# Patient Record
Sex: Male | Born: 1990 | Race: White | Hispanic: No | Marital: Single | Smoking: Former smoker
Health system: Southern US, Community
[De-identification: ages and names within clinical notes are randomized; demographics above are authoritative.]

## PROBLEM LIST (undated history)

## (undated) DIAGNOSIS — R002 Palpitations: Secondary | ICD-10-CM

## (undated) DIAGNOSIS — Z8619 Personal history of other infectious and parasitic diseases: Secondary | ICD-10-CM

## (undated) DIAGNOSIS — J45990 Exercise induced bronchospasm: Secondary | ICD-10-CM

## (undated) DIAGNOSIS — F419 Anxiety disorder, unspecified: Secondary | ICD-10-CM

## (undated) DIAGNOSIS — F329 Major depressive disorder, single episode, unspecified: Secondary | ICD-10-CM

## (undated) DIAGNOSIS — F32A Depression, unspecified: Secondary | ICD-10-CM

## (undated) HISTORY — DX: Major depressive disorder, single episode, unspecified: F32.9

## (undated) HISTORY — DX: Anxiety disorder, unspecified: F41.9

## (undated) HISTORY — DX: Depression, unspecified: F32.A

## (undated) HISTORY — DX: Personal history of other infectious and parasitic diseases: Z86.19

## (undated) HISTORY — DX: Exercise induced bronchospasm: J45.990

## (undated) HISTORY — DX: Palpitations: R00.2

## (undated) HISTORY — PX: WISDOM TOOTH EXTRACTION: SHX21

---

## 2016-01-30 DIAGNOSIS — H6690 Otitis media, unspecified, unspecified ear: Secondary | ICD-10-CM | POA: Diagnosis not present

## 2016-03-15 ENCOUNTER — Encounter: Payer: Self-pay | Admitting: Family

## 2016-03-15 ENCOUNTER — Ambulatory Visit (INDEPENDENT_AMBULATORY_CARE_PROVIDER_SITE_OTHER): Payer: BLUE CROSS/BLUE SHIELD | Admitting: Family

## 2016-03-15 VITALS — BP 118/80 | HR 89 | Temp 98.3°F | Resp 16 | Ht 71.0 in | Wt 200.8 lb

## 2016-03-15 DIAGNOSIS — Z0000 Encounter for general adult medical examination without abnormal findings: Secondary | ICD-10-CM

## 2016-03-15 DIAGNOSIS — Z23 Encounter for immunization: Secondary | ICD-10-CM | POA: Diagnosis not present

## 2016-03-15 DIAGNOSIS — Z0001 Encounter for general adult medical examination with abnormal findings: Secondary | ICD-10-CM | POA: Insufficient documentation

## 2016-03-15 NOTE — Assessment & Plan Note (Signed)
1) Anticipatory Guidance: Discussed importance of wearing a seatbelt while driving and not texting while driving; changing batteries in smoke detector at least once annually; wearing suntan lotion when outside; eating a balanced and moderate diet; getting physical activity at least 30 minutes per day.  2) Immunizations / Screenings / Labs:  Declines tetanus. Due for flu shot at next visit. All other immunizations are up to date per recommendations. Obtain HIV, HSV-1, HSV 2, and RPR for sexual transmitted disease screening. Obtain testosterone for testosterone screening. Obtain magnesium for electrolyte screening. Obtain CBC, CMET, and lipid profile.    Overall well exam with minimal risk factors for cardiovascular disease at this time. BMI indicates overweight. Recommend continuing to exercise and adjust diet accordingly. 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 Indicates work is going very well. Continue healthy lifestyle behaviors and choices. Follow-up prevention exam in 1 year. Follow-up office visit pending blood work as needed.

## 2016-03-15 NOTE — Progress Notes (Signed)
Subjective:    Patient ID: Ricardo Knight, male    DOB: Jan 30, 1991, 25 y.o.   MRN: [id number]  Chief Complaint  Patient presents with  . Establish Care    CPE, not fasting    HPI:  Ricardo Sendericholas Surgeon is a 25 y.o. male who presents today for an annual wellness visit.   1) Health Maintenance -   Diet - Averages about 2-3 meals per day consisting of a regular diet; Caffeine intake of 2-3 cups per week.   Exercise - 3 days per week; primarily resistance with occasional cardio   2) Preventative Exams / Immunizations:  Dental -- Up to date  Vision -- Up to date   Health Maintenance  Topic Date Due  . HIV Screening  01/08/2005  . INFLUENZA VACCINE  10/25/2015  . TETANUS/TDAP  02/25/2017 (Originally 01/08/2009)    Immunization History  Administered Date(s) Administered  . Influenza,inj,Quad PF,36+ Mos 03/15/2016     No Known Allergies   No outpatient prescriptions prior to visit.   No facility-administered medications prior to visit.      Past Medical History:  Diagnosis Date  . Anxiety   . Depression   . Exercise-induced asthma   . Heart palpitations   . History of chicken pox      Past Surgical History:  Procedure Laterality Date  . WISDOM TOOTH EXTRACTION       Family History  Problem Relation Age of Onset  . Depression Mother   . Anxiety disorder Mother   . Hypertension Father   . Hyperlipidemia Father   . Anxiety disorder Maternal Grandmother   . Heart attack Maternal Grandmother   . Heart disease Maternal Grandfather   . Hypertension Maternal Grandfather   . Healthy Paternal Grandmother   . Cancer Paternal Grandfather      Social History   Social History  . Marital status: Single    Spouse name: N/A  . Number of children: 0  . Years of education: 3216   Occupational History  . Lobbyist     Social History Main Topics  . Smoking status: Never Smoker  . Smokeless tobacco: Never Used  . Alcohol use 4.2 - 6.0 oz/week    7 - 10  Cans of beer per week  . Drug use: No  . Sexual activity: Not on file   Other Topics Concern  . Not on file   Social History Narrative   Fun: Exercise, guitar       Review of Systems  Constitutional: Denies fever, chills, fatigue, or significant weight gain/loss. HENT: Head: Denies headache or neck pain Ears: Denies changes in hearing, ringing in ears, earache, drainage Nose: Denies discharge, stuffiness, itching, nosebleed, sinus pain Throat: Denies sore throat, hoarseness, dry mouth, sores, thrush Eyes: Denies loss/changes in vision, pain, redness, blurry/double vision, flashing lights Cardiovascular: Denies chest pain/discomfort, tightness, palpitations, shortness of breath with activity, difficulty lying down, swelling, sudden awakening with shortness of breath Respiratory: Denies shortness of breath, cough, sputum production, wheezing Gastrointestinal: Denies dysphasia, heartburn, change in appetite, nausea, change in bowel habits, rectal bleeding, constipation, diarrhea, yellow skin or eyes Genitourinary: Denies frequency, urgency, burning/pain, blood in urine, incontinence, change in urinary strength. Musculoskeletal: Denies muscle/joint pain, stiffness, back pain, redness or swelling of joints, trauma Skin: Denies rashes, lumps, itching, dryness, color changes, or hair/nail changes Neurological: Denies dizziness, fainting, seizures, weakness, numbness, tingling, tremor Psychiatric - Denies nervousness, stress, depression or memory loss Endocrine: Denies heat or cold intolerance, sweating, frequent urination, excessive  thirst, changes in appetite Hematologic: Denies ease of bruising or bleeding     Objective:     BP 118/80 (BP Location: Left Arm, Patient Position: Sitting, Cuff Size: Normal)   Pulse 89   Temp 98.3 F (36.8 C) (Oral)   Resp 16   Ht 5\' 11"  (1.803 m)   Wt 200 lb 12.8 oz (91.1 kg)   SpO2 98%   BMI 28.01 kg/m  Nursing note and vital signs  reviewed.  Physical Exam  Constitutional: He is oriented to person, place, and time. He appears well-developed and well-nourished.  HENT:  Head: Normocephalic.  Right Ear: Hearing, tympanic membrane, external ear and ear canal normal.  Left Ear: Hearing, tympanic membrane, external ear and ear canal normal.  Nose: Nose normal.  Mouth/Throat: Uvula is midline, oropharynx is clear and moist and mucous membranes are normal.  Eyes: Conjunctivae and EOM are normal. Pupils are equal, round, and reactive to light.  Neck: Neck supple. No JVD present. No tracheal deviation present. No thyromegaly present.  Cardiovascular: Normal rate, regular rhythm, normal heart sounds and intact distal pulses.   Pulmonary/Chest: Effort normal and breath sounds normal.  Abdominal: Soft. Bowel sounds are normal. He exhibits no distension and no mass. There is no tenderness. There is no rebound and no guarding.  Musculoskeletal: Normal range of motion. He exhibits no edema or tenderness.  Lymphadenopathy:    He has no cervical adenopathy.  Neurological: He is alert and oriented to person, place, and time. He has normal reflexes. No cranial nerve deficit. He exhibits normal muscle tone. Coordination normal.  Skin: Skin is warm and dry.  Psychiatric: He has a normal mood and affect. His behavior is normal. Judgment and thought content normal.       Assessment & Plan:   Problem List Items Addressed This Visit      Other   Routine general medical examination at a health care facility - Primary    1) Anticipatory Guidance: Discussed importance of wearing a seatbelt while driving and not texting while driving; changing batteries in smoke detector at least once annually; wearing suntan lotion when outside; eating a balanced and moderate diet; getting physical activity at least 30 minutes per day.  2) Immunizations / Screenings / Labs:  Declines tetanus. Due for flu shot at next visit. All other immunizations are up  to date per recommendations. Obtain HIV, HSV-1, HSV 2, and RPR for sexual transmitted disease screening. Obtain testosterone for testosterone screening. Obtain magnesium for electrolyte screening. Obtain CBC, CMET, and lipid profile.    Overall well exam with minimal risk factors for cardiovascular disease at this time. BMI indicates overweight. Recommend continuing to exercise and adjust diet accordingly. 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 Indicates work is going very well. Continue healthy lifestyle behaviors and choices. Follow-up prevention exam in 1 year. Follow-up office visit pending blood work as needed.       Relevant Orders   CBC   Comprehensive metabolic panel   Lipid panel   TSH   Magnesium   HIV antibody   HSV 1 antibody, IgG   HSV 2 antibody, IgG   RPR   Testosterone    Other Visit Diagnoses    Encounter for immunization       Relevant Orders   Flu Vaccine QUAD 36+ mos IM (Completed)       Mr. Shin does not currently have medications on file.   Follow-up: Return in about 1 year (  around 03/15/2017), or if symptoms worsen or fail to improve.   Jeanine Luzalone, Gregory, FNP

## 2016-03-15 NOTE — Patient Instructions (Addendum)
Thank you for choosing [facility].  SUMMARY AND INSTRUCTIONS:  Very nice to meet you.   Happy Holidays!  For your testosterone check please come before 10am.   Labs:  Please stop by the lab on the lower level of the building for your blood work. Your results will be released to MyChart (or called to you) after review, usually within 72 hours after test completion. If any changes need to be made, you will be notified at that same time.  1.) The lab is open from 7:30am to 5:30 pm Monday-Friday 2.) No appointment is necessary 3.) Fasting (if needed) is 6-8 hours after food and drink; black coffee and water are okay   Follow up:  If your symptoms worsen or fail to improve, please contact our office for further instruction, or in case of emergency go directly to the emergency room at the closest medical facility.   Health Maintenance, Male A healthy lifestyle and preventative care can promote health and wellness.  Maintain regular health, dental, and eye exams.  Eat a healthy diet. Foods like vegetables, fruits, whole grains, low-fat dairy products, and lean protein foods contain the nutrients you need and are low in calories. Decrease your intake of foods high in solid fats, added sugars, and salt. Get information about a proper diet from your health care provider, if necessary.  Regular physical exercise is one of the most important things you can do for your health. Most adults should get at least 150 minutes of moderate-intensity exercise (any activity that increases your heart rate and causes you to sweat) each week. In addition, most adults need muscle-strengthening exercises on 2 or more days a week.   Maintain a healthy weight. The body mass index (BMI) is a screening tool to identify possible weight problems. It provides an estimate of body fat based on height and weight. Your health care provider can find your BMI and can help you achieve or maintain a healthy weight. For  males 20 years and older:  A BMI below 18.5 is considered underweight.  A BMI of 18.5 to 24.9 is normal.  A BMI of 25 to 29.9 is considered overweight.  A BMI of 30 and above is considered obese.  Maintain normal blood lipids and cholesterol by exercising and minimizing your intake of saturated fat. Eat a balanced diet with plenty of fruits and vegetables. Blood tests for lipids and cholesterol should begin at age 26 and be repeated every 5 years. If your lipid or cholesterol levels are high, you are over age 250, or you are at high risk for heart disease, you may need your cholesterol levels checked more frequently.Ongoing high lipid and cholesterol levels should be treated with medicines if diet and exercise are not working.  If you smoke, find out from your health care provider how to quit. If you do not use tobacco, do not start.  Lung cancer screening is recommended for adults aged 55-80 years who are at high risk for developing lung cancer because of a history of smoking. A yearly low-dose CT scan of the lungs is recommended for people who have at least a 30-pack-year history of smoking and are current smokers or have quit within the past 15 years. A pack year of smoking is smoking an average of 1 pack of cigarettes a day for 1 year (for example, a 30-pack-year history of smoking could mean smoking 1 pack a day for 30 years or 2 packs a day for 15 years). Yearly  screening should continue until the smoker has stopped smoking for at least 15 years. Yearly screening should be stopped for people who develop a health problem that would prevent them from having lung cancer treatment.  If you choose to drink alcohol, do not have more than 2 drinks per day. One drink is considered to be 12 oz (360 mL) of beer, 5 oz (150 mL) of wine, or 1.5 oz (45 mL) of liquor.  Avoid the use of street drugs. Do not share needles with anyone. Ask for help if you need support or instructions about stopping the use of  drugs.  High blood pressure causes heart disease and increases the risk of stroke. High blood pressure is more likely to develop in:  People who have blood pressure in the end of the normal range (100-139/85-89 mm Hg).  People who are overweight or obese.  People who are African American.  If you are 48-31 years of age, have your blood pressure checked every 3-5 years. If you are 85 years of age or older, have your blood pressure checked every year. You should have your blood pressure measured twice-once when you are at a hospital or clinic, and once when you are not at a hospital or clinic. Record the average of the two measurements. To check your blood pressure when you are not at a hospital or clinic, you can use:  An automated blood pressure machine at a pharmacy.  A home blood pressure monitor.  If you are 12-71 years old, ask your health care provider if you should take aspirin to prevent heart disease.  Diabetes screening involves taking a blood sample to check your fasting blood sugar level. This should be done once every 3 years after age 67 if you are at a normal weight and without risk factors for diabetes. Testing should be considered at a younger age or be carried out more frequently if you are overweight and have at least 1 risk factor for diabetes.  Colorectal cancer can be detected and often prevented. Most routine colorectal cancer screening begins at the age of 22 and continues through age 8. However, your health care provider may recommend screening at an earlier age if you have risk factors for colon cancer. On a yearly basis, your health care provider may provide home test kits to check for hidden blood in the stool. A small camera at the end of a tube may be used to directly examine the colon (sigmoidoscopy or colonoscopy) to detect the earliest forms of colorectal cancer. Talk to your health care provider about this at age 8 when routine screening begins. A direct exam of  the colon should be repeated every 5-10 years through age 69, unless early forms of precancerous polyps or small growths are found.  People who are at an increased risk for hepatitis B should be screened for this virus. You are considered at high risk for hepatitis B if:  You were born in a country where hepatitis B occurs often. Talk with your health care provider about which countries are considered high risk.  Your parents were born in a high-risk country and you have not received a shot to protect against hepatitis B (hepatitis B vaccine).  You have HIV or AIDS.  You use needles to inject street drugs.  You live with, or have sex with, someone who has hepatitis B.  You are a man who has sex with other men (MSM).  You get hemodialysis treatment.  You take  certain medicines for conditions like cancer, organ transplantation, and autoimmune conditions.  Hepatitis C blood testing is recommended for all people born from [id number] through 1965 and any individual with known risk factors for hepatitis C.  Healthy men should no longer receive prostate-specific antigen (PSA) blood tests as part of routine cancer screening. Talk to your health care provider about prostate cancer screening.  Testicular cancer screening is not recommended for adolescents or adult males who have no symptoms. Screening includes self-exam, a health care provider exam, and other screening tests. Consult with your health care provider about any symptoms you have or any concerns you have about testicular cancer.  Practice safe sex. Use condoms and avoid high-risk sexual practices to reduce the spread of sexually transmitted infections (STIs).  You should be screened for STIs, including gonorrhea and chlamydia if:  You are sexually active and are younger than 24 years.  You are older than 24 years, and your health care provider tells you that you are at risk for this type of infection.  Your sexual activity has changed  since you were last screened, and you are at an increased risk for chlamydia or gonorrhea. Ask your health care provider if you are at risk.  If you are at risk of being infected with HIV, it is recommended that you take a prescription medicine daily to prevent HIV infection. This is called pre-exposure prophylaxis (PrEP). You are considered at risk if:  You are a man who has sex with other men (MSM).  You are a heterosexual man who is sexually active with multiple partners.  You take drugs by injection.  You are sexually active with a partner who has HIV.  Talk with your health care provider about whether you are at high risk of being infected with HIV. If you choose to begin PrEP, you should first be tested for HIV. You should then be tested every 3 months for as long as you are taking PrEP.  Use sunscreen. Apply sunscreen liberally and repeatedly throughout the day. You should seek shade when your shadow is shorter than you. Protect yourself by wearing long sleeves, pants, a wide-brimmed hat, and sunglasses year round whenever you are outdoors.  Tell your health care provider of new moles or changes in moles, especially if there is a change in shape or color. Also, tell your health care provider if a mole is larger than the size of a pencil eraser.  A one-time screening for abdominal aortic aneurysm (AAA) and surgical repair of large AAAs by ultrasound is recommended for men aged 65-75 years who are current or former smokers.  Stay current with your vaccines (immunizations). This information is not intended to replace advice given to you by your health care provider. Make sure you discuss any questions you have with your health care provider. Document Released: 09/08/2007 Document Revised: 04/02/2014 Document Reviewed: 12/14/2014 Elsevier Interactive Patient Education  2017 [employer]

## 2016-03-28 ENCOUNTER — Other Ambulatory Visit (INDEPENDENT_AMBULATORY_CARE_PROVIDER_SITE_OTHER): Payer: BLUE CROSS/BLUE SHIELD

## 2016-03-28 DIAGNOSIS — Z0000 Encounter for general adult medical examination without abnormal findings: Secondary | ICD-10-CM

## 2016-03-28 LAB — COMPREHENSIVE METABOLIC PANEL
ALT: 54 U/L — ABNORMAL HIGH (ref 0–53)
AST: 29 U/L (ref 0–37)
Albumin: 4.6 g/dL (ref 3.5–5.2)
Alkaline Phosphatase: 54 U/L (ref 39–117)
BUN: 13 mg/dL (ref 6–23)
CALCIUM: 9.6 mg/dL (ref 8.4–10.5)
CHLORIDE: 100 meq/L (ref 96–112)
CO2: 30 meq/L (ref 19–32)
Creatinine, Ser: 0.95 mg/dL (ref 0.40–1.50)
GFR: 101.68 mL/min (ref >60.00)
GLUCOSE: 103 mg/dL — AB (ref 70–99)
Potassium: 3.8 mEq/L (ref 3.5–5.1)
Sodium: 138 mEq/L (ref 135–145)
Total Bilirubin: 0.9 mg/dL (ref 0.2–1.2)
Total Protein: 7.1 g/dL (ref 6.0–8.3)

## 2016-03-28 LAB — CBC
HCT: 42.8 % (ref 39.0–52.0)
HEMOGLOBIN: 14.9 g/dL (ref 13.0–17.0)
MCHC: 34.7 g/dL (ref 30.0–36.0)
MCV: 91.3 fl (ref 78.0–100.0)
PLATELETS: 170 10*3/uL (ref 150.0–400.0)
RBC: 4.69 Mil/uL (ref 4.22–5.81)
RDW: 12.7 % (ref 11.5–15.5)
WBC: 5.9 10*3/uL (ref 4.0–10.5)

## 2016-03-28 LAB — LIPID PANEL
CHOL/HDL RATIO: 3
Cholesterol: 176 mg/dL (ref 0–200)
HDL: 60.1 mg/dL (ref >39.00)
LDL CALC: 98 mg/dL (ref 0–99)
NONHDL: 115.44
TRIGLYCERIDES: 85 mg/dL (ref 0.0–149.0)
VLDL: 17 mg/dL (ref 0.0–40.0)

## 2016-03-28 LAB — MAGNESIUM: Magnesium: 2 mg/dL (ref 1.5–2.5)

## 2016-03-28 LAB — HIV ANTIBODY (ROUTINE TESTING W REFLEX): HIV 1&2 Ab, 4th Generation: NONREACTIVE

## 2016-03-28 LAB — TESTOSTERONE: TESTOSTERONE: 515.3 ng/dL (ref 300.00–890.00)

## 2016-03-28 LAB — TSH: TSH: 1.53 u[IU]/mL (ref 0.35–4.50)

## 2016-03-29 LAB — HSV 1 ANTIBODY, IGG: HSV 1 Glycoprotein G Ab, IgG: <0.9 Index (ref <0.90)

## 2016-03-29 LAB — RPR

## 2016-03-29 LAB — HSV 2 ANTIBODY, IGG: HSV 2 Glycoprotein G Ab, IgG: <0.9 Index (ref <0.90)

## 2016-05-10 ENCOUNTER — Encounter: Payer: Self-pay | Admitting: Nurse Practitioner

## 2016-05-10 ENCOUNTER — Ambulatory Visit (INDEPENDENT_AMBULATORY_CARE_PROVIDER_SITE_OTHER): Payer: BLUE CROSS/BLUE SHIELD | Admitting: Nurse Practitioner

## 2016-05-10 VITALS — BP 146/80 | HR 64 | Temp 97.4°F | Ht 71.0 in | Wt 203.0 lb

## 2016-05-10 DIAGNOSIS — J029 Acute pharyngitis, unspecified: Secondary | ICD-10-CM

## 2016-05-10 DIAGNOSIS — J209 Acute bronchitis, unspecified: Secondary | ICD-10-CM

## 2016-05-10 LAB — POCT RAPID STREP A (OFFICE): Rapid Strep A Screen: NEGATIVE

## 2016-05-10 MED ORDER — PREDNISONE 10 MG (21) PO TBPK: 10.0000 mg | ORAL_TABLET | ORAL | 0 refills | Status: DC

## 2016-05-10 MED ORDER — DM-GUAIFENESIN ER 30-600 MG PO TB12: 1.0000 | ORAL_TABLET | Freq: Two times a day (BID) | ORAL | 0 refills | Status: DC | PRN

## 2016-05-10 MED ORDER — HYDROCODONE-HOMATROPINE 5-1.5 MG/5ML PO SYRP: 5.0000 mL | ORAL_SOLUTION | Freq: Every evening | ORAL | 0 refills | Status: DC | PRN

## 2016-05-10 MED ORDER — AMOXICILLIN 875 MG PO TABS: 875.0000 mg | ORAL_TABLET | Freq: Two times a day (BID) | ORAL | 0 refills | Status: DC

## 2016-05-10 NOTE — Progress Notes (Signed)
Subjective:  Patient ID: Ricardo Knight, male    DOB: 09/06/1990  Age: 26 y.o. MRN: [id number]  CC: Nasal Congestion (head/chest congestion, sore throat,hard to sleep going on for 2 wks. took mucinex)   Cough  This is a new problem. The current episode started 1 to 4 weeks ago. The problem has been waxing and waning. The problem occurs constantly. The cough is non-productive. Associated symptoms include nasal congestion, postnasal drip and a sore throat. Pertinent negatives include no chest pain, chills, ear congestion, ear pain, fever, headaches, heartburn, hemoptysis, myalgias, rhinorrhea, shortness of breath, sweats, weight loss or wheezing. The symptoms are aggravated by cold air and lying down. He has tried OTC cough suppressant for the symptoms. The treatment provided no relief.    No outpatient prescriptions prior to visit.   No facility-administered medications prior to visit.     ROS See HPI  Objective:  BP (!) 146/80   Pulse 64   Temp 97.4 F (36.3 C)   Ht 5\' 11"  (1.803 m)   Wt 203 lb (92.1 kg)   SpO2 98%   BMI 28.31 kg/m   BP Readings from Last 3 Encounters:  05/10/16 (!) 146/80  03/15/16 118/80    Wt Readings from Last 3 Encounters:  05/10/16 203 lb (92.1 kg)  03/15/16 200 lb 12.8 oz (91.1 kg)    Physical Exam  Constitutional: He is oriented to person, place, and time. No distress.  HENT:  Right Ear: Tympanic membrane, external ear and ear canal normal.  Left Ear: Tympanic membrane and ear canal normal.  Nose: Mucosal edema and rhinorrhea present. Right sinus exhibits maxillary sinus tenderness and frontal sinus tenderness. Left sinus exhibits maxillary sinus tenderness and frontal sinus tenderness.  Mouth/Throat: Uvula is midline. Posterior oropharyngeal erythema present. No oropharyngeal exudate.  Eyes: No scleral icterus.  Neck: Normal range of motion. Neck supple.  Cardiovascular: Normal rate and regular rhythm.   Pulmonary/Chest: Effort normal and  breath sounds normal.  Lymphadenopathy:    He has cervical adenopathy.  Neurological: He is alert and oriented to person, place, and time.  Vitals reviewed.   Lab Results  Component Value Date   WBC 5.9 03/28/2016   HGB 14.9 03/28/2016   HCT 42.8 03/28/2016   PLT 170.0 03/28/2016   GLUCOSE 103 (H) 03/28/2016   CHOL 176 03/28/2016   TRIG 85.0 03/28/2016   HDL 60.10 03/28/2016   LDLCALC 98 03/28/2016   ALT 54 (H) 03/28/2016   AST 29 03/28/2016   NA 138 03/28/2016   K 3.8 03/28/2016   CL 100 03/28/2016   CREATININE 0.95 03/28/2016   BUN 13 03/28/2016   CO2 30 03/28/2016   TSH 1.53 03/28/2016    Patient was never admitted.  Assessment & Plan:   Ricardo Knight was seen today for nasal congestion.  Diagnoses and all orders for this visit:  Acute bronchitis, unspecified organism -     POCT rapid strep A -     predniSONE (STERAPRED UNI-PAK 21 TAB) 10 MG (21) TBPK tablet; Take 1 tablet (10 mg total) by mouth as directed. -     HYDROcodone-homatropine (HYCODAN) 5-1.5 MG/5ML syrup; Take 5 mLs by mouth at bedtime as needed for cough. -     dextromethorphan-guaiFENesin (MUCINEX DM) 30-600 MG 12hr tablet; Take 1 tablet by mouth 2 (two) times daily as needed for cough. -     amoxicillin (AMOXIL) 875 MG tablet; Take 1 tablet (875 mg total) by mouth 2 (two) times daily.  Acute  pharyngitis, unspecified etiology -     POCT rapid strep A -     predniSONE (STERAPRED UNI-PAK 21 TAB) 10 MG (21) TBPK tablet; Take 1 tablet (10 mg total) by mouth as directed. -     HYDROcodone-homatropine (HYCODAN) 5-1.5 MG/5ML syrup; Take 5 mLs by mouth at bedtime as needed for cough. -     dextromethorphan-guaiFENesin (MUCINEX DM) 30-600 MG 12hr tablet; Take 1 tablet by mouth 2 (two) times daily as needed for cough. -     amoxicillin (AMOXIL) 875 MG tablet; Take 1 tablet (875 mg total) by mouth 2 (two) times daily.   I am having Ricardo Knight start on predniSONE, HYDROcodone-homatropine,  dextromethorphan-guaiFENesin, and amoxicillin.  Meds ordered this encounter  Medications  . predniSONE (STERAPRED UNI-PAK 21 TAB) 10 MG (21) TBPK tablet    Sig: Take 1 tablet (10 mg total) by mouth as directed.    Dispense:  21 tablet    Refill:  0    Order Specific Question:   Supervising Provider    Answer:   Tresa Garter [[id number]]  . HYDROcodone-homatropine (HYCODAN) 5-1.5 MG/5ML syrup    Sig: Take 5 mLs by mouth at bedtime as needed for cough.    Dispense:  120 mL    Refill:  0    Order Specific Question:   Supervising Provider    Answer:   Tresa Garter [[id number]]  . dextromethorphan-guaiFENesin (MUCINEX DM) 30-600 MG 12hr tablet    Sig: Take 1 tablet by mouth 2 (two) times daily as needed for cough.    Dispense:  14 tablet    Refill:  0    Order Specific Question:   Supervising Provider    Answer:   Tresa Garter [[id number]]  . amoxicillin (AMOXIL) 875 MG tablet    Sig: Take 1 tablet (875 mg total) by mouth 2 (two) times daily.    Dispense:  20 tablet    Refill:  0    Order Specific Question:   Supervising Provider    Answer:   Tresa Garter [[id number]]    Follow-up: Return if symptoms worsen or fail to improve.  Alysia Penna, NP

## 2016-05-10 NOTE — Patient Instructions (Signed)
URI Instructions: Encourage adequate oral hydration.  Use over-the-counter medicines "Mucinex" or" Mucinex D"  for cough and congestion.  You can use plain "Tylenol" or "Advi"l for fever, chills and achyness.

## 2016-05-10 NOTE — Progress Notes (Signed)
Pre visit review using our clinic review tool, if applicable. No additional management support is needed unless otherwise documented below in the visit note. 

## 2017-01-03 ENCOUNTER — Telehealth: Payer: Self-pay | Admitting: Family
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
## 2017-01-04 ENCOUNTER — Encounter: Payer: Self-pay | Admitting: Family Medicine

## 2017-01-04 ENCOUNTER — Ambulatory Visit (INDEPENDENT_AMBULATORY_CARE_PROVIDER_SITE_OTHER): Payer: BLUE CROSS/BLUE SHIELD | Admitting: Family Medicine

## 2017-01-04 VITALS — BP 112/66 | HR 64 | Temp 98.0°F | Ht 71.0 in | Wt 199.0 lb

## 2017-01-04 DIAGNOSIS — F419 Anxiety disorder, unspecified: Secondary | ICD-10-CM

## 2017-01-04 NOTE — Patient Instructions (Signed)
Thank you for coming in,   Please give me a call if you don't have improvement of your symptoms.    Please feel free to call with any questions or concerns at any time, at [phone]. --Dr. Jordan Likes
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
## 2017-01-05 DIAGNOSIS — F419 Anxiety disorder, unspecified: Secondary | ICD-10-CM | POA: Insufficient documentation
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
## 2017-02-20 ENCOUNTER — Other Ambulatory Visit (INDEPENDENT_AMBULATORY_CARE_PROVIDER_SITE_OTHER): Payer: BLUE CROSS/BLUE SHIELD

## 2017-02-20 ENCOUNTER — Ambulatory Visit (INDEPENDENT_AMBULATORY_CARE_PROVIDER_SITE_OTHER): Payer: BLUE CROSS/BLUE SHIELD | Admitting: Nurse Practitioner

## 2017-02-20 ENCOUNTER — Encounter: Payer: Self-pay | Admitting: Nurse Practitioner

## 2017-02-20 VITALS — BP 124/72 | HR 82 | Temp 98.6°F | Resp 16 | Ht 71.0 in | Wt 200.8 lb

## 2017-02-20 DIAGNOSIS — Z7253 High risk bisexual behavior: Secondary | ICD-10-CM

## 2017-02-20 DIAGNOSIS — Z0000 Encounter for general adult medical examination without abnormal findings: Secondary | ICD-10-CM

## 2017-02-20 DIAGNOSIS — Z23 Encounter for immunization: Secondary | ICD-10-CM | POA: Diagnosis not present

## 2017-02-20 DIAGNOSIS — E876 Hypokalemia: Secondary | ICD-10-CM

## 2017-02-20 DIAGNOSIS — Z0001 Encounter for general adult medical examination with abnormal findings: Secondary | ICD-10-CM

## 2017-02-20 DIAGNOSIS — L858 Other specified epidermal thickening: Secondary | ICD-10-CM | POA: Diagnosis not present

## 2017-02-20 DIAGNOSIS — Z114 Encounter for screening for human immunodeficiency virus [HIV]: Secondary | ICD-10-CM

## 2017-02-20 LAB — COMPREHENSIVE METABOLIC PANEL
ALBUMIN: 4.9 g/dL (ref 3.5–5.2)
ALK PHOS: 55 U/L (ref 39–117)
ALT: 38 U/L (ref 0–53)
AST: 25 U/L (ref 0–37)
BILIRUBIN TOTAL: 0.5 mg/dL (ref 0.2–1.2)
BUN: 21 mg/dL (ref 6–23)
CALCIUM: 9.9 mg/dL (ref 8.4–10.5)
CO2: 31 mEq/L (ref 19–32)
CREATININE: 0.94 mg/dL (ref 0.40–1.50)
Chloride: 99 mEq/L (ref 96–112)
GFR: 102.23 mL/min (ref >60.00)
Glucose, Bld: 88 mg/dL (ref 70–99)
Potassium: 3.8 mEq/L (ref 3.5–5.1)
Sodium: 137 mEq/L (ref 135–145)
Total Protein: 7.6 g/dL (ref 6.0–8.3)

## 2017-02-20 NOTE — Patient Instructions (Addendum)
Please head downstairs for lab work.  Ill see you back in 1 year, or sooner if you need me.  It was nice to meet you. Thanks for letting me take care of you today :)  Preventive Care 18-39 Years, Male Preventive care refers to lifestyle choices and visits with your health care provider that can promote health and wellness. What does preventive care include?  A yearly physical exam. This is also called an annual well check.  Dental exams once or twice a year.  Routine eye exams. Ask your health care provider how often you should have your eyes checked.  Personal lifestyle choices, including: ? Daily care of your teeth and gums. ? Regular physical activity. ? Eating a healthy diet. ? Avoiding tobacco and drug use. ? Limiting alcohol use. ? Practicing safe sex. What happens during an annual well check? The services and screenings done by your health care provider during your annual well check will depend on your age, overall health, lifestyle risk factors, and family history of disease. Counseling Your health care provider may ask you questions about your:  Alcohol use.  Tobacco use.  Drug use.  Emotional well-being.  Home and relationship well-being.  Sexual activity.  Eating habits.  Work and work Statistician.  Screening You may have the following tests or measurements:  Height, weight, and BMI.  Blood pressure.  Lipid and cholesterol levels. These may be checked every 5 years starting at age 51.  Diabetes screening. This is done by checking your blood sugar (glucose) after you have not eaten for a while (fasting).  Skin check.  Hepatitis C blood test.  Hepatitis B blood test.  Sexually transmitted disease (STD) testing.  Discuss your test results, treatment options, and if necessary, the need for more tests with your health care provider. Vaccines Your health care provider may recommend certain vaccines, such as:  Influenza vaccine. This is  recommended every year.  Tetanus, diphtheria, and acellular pertussis (Tdap, Td) vaccine. You may need a Td booster every 10 years.  Varicella vaccine. You may need this if you have not been vaccinated.  HPV vaccine. If you are 49 or younger, you may need three doses over 6 months.  Measles, mumps, and rubella (MMR) vaccine. You may need at least one dose of MMR.You may also need a second dose.  Pneumococcal 13-valent conjugate (PCV13) vaccine. You may need this if you have certain conditions and have not been vaccinated.  Pneumococcal polysaccharide (PPSV23) vaccine. You may need one or two doses if you smoke cigarettes or if you have certain conditions.  Meningococcal vaccine. One dose is recommended if you are age 31-21 years and a first-year college student living in a residence hall, or if you have one of several medical conditions. You may also need additional booster doses.  Hepatitis A vaccine. You may need this if you have certain conditions or if you travel or work in places where you may be exposed to hepatitis A.  Hepatitis B vaccine. You may need this if you have certain conditions or if you travel or work in places where you may be exposed to hepatitis B.  Haemophilus influenzae type b (Hib) vaccine. You may need this if you have certain risk factors.  Talk to your health care provider about which screenings and vaccines you need and how often you need them. This information is not intended to replace advice given to you by your health care provider. Make sure you discuss any questions you  have with your health care provider. Document Released: 05/08/2001 Document Revised: 11/30/2015 Document Reviewed: 01/11/2015 Elsevier Interactive Patient Education  2017 [employer].

## 2017-02-20 NOTE — Assessment & Plan Note (Signed)
Need for influenza vaccination - Flu Vaccine QUAD 36+ mos IM Screening for HIV (human immunodeficiency virus) - HIV antibody-future Health maintenance up to date.  Healthy diet and exercise discussed including reduced red meat and 1/2 plate of veggies at meals, getting 150 minutes of exercise per week. Sunscreen, seatbelt discussed.  Preventive care handout given.  RTC in 1 year for CPE or sooner if needed.

## 2017-02-20 NOTE — Progress Notes (Addendum)
Subjective:    Patient ID: Ricardo Knight, male    DOB: 1991-02-17, 26 y.o.   MRN: [id number]  HPI  Ricardo Knight is a 26 yo who presents today to establish care. He is transferring to me from another provider in the same clinic. Patient presents today for complete physical.  Immunizations: Flu: today Tdap: declines  Smoker: never Vision: not routinely Dental: biannually Diet: working on improving 2-3 fruit and vegetable servings a day, light sweets, tracking calories Trying to eat out less. Drinks unsweet tea, water. Exercise: About 3-5 days, 150 minutes a week- weights, cardio. Seatbelt-yes; smoking cessation-n/a.  Review of Systems  Constitutional: Negative.  Negative for activity change and appetite change.  HENT: Negative for dental problem and voice change.   Eyes: Negative for visual disturbance.  Respiratory: Negative for cough and shortness of breath.   Cardiovascular: Negative for chest pain and palpitations.  Gastrointestinal: Negative for constipation and diarrhea.  Endocrine: Negative for cold intolerance and heat intolerance.  Genitourinary: Negative for difficulty urinating and hematuria.  Musculoskeletal: Negative for arthralgias and myalgias.  Skin: Positive for rash.  Allergic/Immunologic: Positive for environmental allergies. Negative for food allergies.  Neurological: Negative for speech difficulty and headaches.  Hematological: Does not bruise/bleed easily.  Psychiatric/Behavioral:       Negative for depression or anxiety.    Rash- chronic, present for years. The rash is not painful or itchy. The rash is small red bumps to bilateral upper arms. He has tried mild soaps, avoiding hot water without relief. He does not like the way the rash looks. He is requesting referral to dermatology  Past Medical History:  Diagnosis Date  . Anxiety   . Depression   . Exercise-induced asthma   . Heart palpitations   . History of chicken pox      Social History    Socioeconomic History  . Marital status: Single    Spouse name: Not on file  . Number of children: 0  . Years of education: 7916  . Highest education level: Not on file  Social Needs  . Financial resource strain: Not on file  . Food insecurity - worry: Not on file  . Food insecurity - inability: Not on file  . Transportation needs - medical: Not on file  . Transportation needs - non-medical: Not on file  Occupational History  . Occupation: Lobbyist   Tobacco Use  . Smoking status: Never Smoker  . Smokeless tobacco: Never Used  Substance and Sexual Activity  . Alcohol use: Yes    Alcohol/week: 4.2 - 6.0 oz    Types: 7 - 10 Cans of beer per week  . Drug use: No  . Sexual activity: Not on file  Other Topics Concern  . Not on file  Social History Narrative   Fun: Exercise, guitar     Past Surgical History:  Procedure Laterality Date  . WISDOM TOOTH EXTRACTION      Family History  Problem Relation Age of Onset  . Depression Mother   . Anxiety disorder Mother   . Hypertension Father   . Hyperlipidemia Father   . Anxiety disorder Maternal Grandmother   . Heart attack Maternal Grandmother   . Heart disease Maternal Grandfather   . Hypertension Maternal Grandfather   . Healthy Paternal Grandmother   . Cancer Paternal Grandfather     No Known Allergies  Current Outpatient Medications on File Prior to Visit  Medication Sig Dispense Refill  . loratadine (CLARITIN) 10 MG tablet Take  10 mg by mouth daily.     No current facility-administered medications on file prior to visit.        Objective:   Physical Exam   BP 124/72 (BP Location: Left Arm, Patient Position: Sitting, Cuff Size: Large)   Pulse 82   Temp 98.6 F (37 C) (Oral)   Resp 16   Ht 5\' 11"  (1.803 m)   Wt 200 lb 12.8 oz (91.1 kg)   SpO2 98%   BMI 28.01 kg/m   General Appearance:    Alert, cooperative, no distress, appears stated age  Head:    Normocephalic, without obvious abnormality,  atraumatic  Eyes:    PERRL, conjunctiva/corneas clear, EOM's intact  Ears:    Normal TM's and external ear canals, both ears  Nose:   Nares normal, septum midline, mucosa normal, no drainage   or sinus tenderness  Throat:   Lips, mucosa, and tongue normal; teeth and gums normal  Neck:   Supple, symmetrical, trachea midline, no adenopathy;       thyroid:  No enlargement/tenderness/nodules  Back:     Symmetric, no curvature, ROM normal, no CVA tenderness  Lungs:     Clear to auscultation bilaterally, respirations unlabored  Chest wall:    No tenderness or deformity  Heart:    Regular rate and rhythm, heart sounds normal, no murmur, rub or gallop  Abdomen:     Soft, non-tender, bowel sounds active all four quadrants,    no masses, no organomegaly  Genitalia:    Normal male without lesion, discharge or tenderness  Rectal:    Normal tone, normal prostate, no masses or tenderness;   guaiac negative stool  Extremities:   Extremities normal, atraumatic, no cyanosis or edema  Pulses:   2+ and symmetric all extremities  Skin:   Skin color, texture, turgor normal, numerous light red papules to extensor surfaces of bilateral proximal arms  Lymph nodes:   Cervical, supraclavicular nodes normal  Neurologic:   CNII-XII intact. 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 He would like referral to derm. - Ambulatory referral to Dermatology

## 2017-02-21 LAB — HSV 1 ANTIBODY, IGG

## 2017-02-21 LAB — RPR: RPR Ser Ql: NONREACTIVE

## 2017-02-21 LAB — HSV 2 ANTIBODY, IGG: HSV 2 Glycoprotein G Ab, IgG: <0.9 index

## 2017-02-21 LAB — HIV ANTIBODY (ROUTINE TESTING W REFLEX): HIV 1&2 Ab, 4th Generation: NONREACTIVE

## 2017-03-04 ENCOUNTER — Ambulatory Visit: Payer: Self-pay | Admitting: *Deleted

## 2017-03-04 NOTE — Telephone Encounter (Signed)
  Reason for Disposition . [1] SEVERE back pain (e.g., excruciating, unable to do any normal activities) AND [2] not improved 2 hours after pain medicine  Answer Assessment - Initial Assessment Questions 1. ONSET: "When did the pain begin?"      Today with stretching  2. LOCATION: "Where does it hurt?" (upper, mid or lower back)     Upper back between shoulder blades 3. SEVERITY: "How bad is the pain?"  (e.g., Scale 1-10; mild, moderate, or severe)   - MILD (1-3): doesn't interfere with normal activities    - MODERATE (4-7): interferes with normal activities or awakens from sleep    - SEVERE (8-10): excruciating pain, unable to do any normal activities     Close to a 10. Comes and goes like a spasm. 4. PATTERN: "Is the pain constant?" (e.g., yes, no; constant, intermittent)     intermittent 5. RADIATION: "Does the pain shoot into your legs or elsewhere?"    no 6. CAUSE:  "What do you think is causing the back pain?"    stretching 7. BACK OVERUSE:  "Any recent lifting of heavy objects, strenuous work or exercise?"     No just stretching 8. MEDICATIONS: "What have you taken so far for the pain?" (e.g., nothing, acetaminophen, NSAIDS)     2 tylenol helped some 9. NEUROLOGIC SYMPTOMS: "Do you have any weakness, numbness, or problems with bowel/bladder control?"     no 10. OTHER SYMPTOMS: "Do you have any other symptoms?" (e.g., fever, abdominal pain, burning with urination, blood in urine)       no 11. PREGNANCY: "Is there any chance you are pregnant?" (e.g., yes, no; LMP)     na  Protocols used: BACK PAIN-A-AH

## 2017-04-08 DIAGNOSIS — L858 Other specified epidermal thickening: Secondary | ICD-10-CM | POA: Diagnosis not present

## 2017-05-05 ENCOUNTER — Other Ambulatory Visit: Payer: Self-pay

## 2017-05-05 ENCOUNTER — Ambulatory Visit (HOSPITAL_COMMUNITY)
Admission: EM | Admit: 2017-05-05 | Discharge: 2017-05-05 | Disposition: A | Discharge status: Home / Self Care | Payer: BLUE CROSS/BLUE SHIELD | Attending: Internal Medicine | Admitting: Internal Medicine

## 2017-05-05 ENCOUNTER — Encounter (HOSPITAL_COMMUNITY): Payer: Self-pay | Admitting: *Deleted

## 2017-05-05 ENCOUNTER — Ambulatory Visit (INDEPENDENT_AMBULATORY_CARE_PROVIDER_SITE_OTHER): Payer: BLUE CROSS/BLUE SHIELD

## 2017-05-05 DIAGNOSIS — J181 Lobar pneumonia, unspecified organism: Secondary | ICD-10-CM

## 2017-05-05 DIAGNOSIS — J189 Pneumonia, unspecified organism: Secondary | ICD-10-CM

## 2017-05-05 DIAGNOSIS — R05 Cough: Secondary | ICD-10-CM | POA: Diagnosis not present

## 2017-05-05 MED ORDER — AZITHROMYCIN 250 MG PO TABS: ORAL_TABLET | ORAL | 0 refills | Status: DC

## 2017-05-05 NOTE — ED Provider Notes (Signed)
[facility]    CSN: [id number] Arrival date & time: 05/05/17  1417     History   Chief Complaint Chief Complaint  Patient presents with  . Generalized Body Aches    chills  . Fatigue  . Cough    HPI Ricardo Knight is a 27 y.o. male.   Ricardo Knight presents with complaints of fatigue, low grade fevers, cough which is primarily dry, occasionally dizzy with activity, which started 2/6. No known ill contacts. Did get his flu vaccine. States he feels he is generally improving but cough has worsened. Temps of 99/100 range. Has been taking tylenol and mucinex dm. Tylenol last at 1100. Without chest pain or shortness of breath. States he has had bronchitis type illnesses in the past. Exercise-induced asthma.     ROS per HPI.       Past Medical History:  Diagnosis Date  . Anxiety   . Depression   . Exercise-induced asthma   . Heart palpitations   . History of chicken pox     Patient Active Problem List   Diagnosis Date Noted  . Anxiety 01/05/2017  . Acute pharyngitis 05/10/2016  . Routine general medical examination at a health care facility 03/15/2016    Past Surgical History:  Procedure Laterality Date  . WISDOM TOOTH EXTRACTION         Home Medications    Prior to Admission medications   Medication Sig Start Date End Date Taking? Authorizing Provider  azithromycin (ZITHROMAX) 250 MG tablet Take first 2 tablets together, then 1 every day until finished. 05/05/17   Georgetta HaberBurky, Larenda Reedy B, NP  loratadine (CLARITIN) 10 MG tablet Take 10 mg by mouth daily.    [provider]    Family History Family History  Problem Relation Age of Onset  . Depression Mother   . Anxiety disorder Mother   . Hypertension Father   . Hyperlipidemia Father   . Anxiety disorder Maternal Grandmother   . Heart attack Maternal Grandmother   . Heart disease Maternal Grandfather   . Hypertension Maternal Grandfather   . Healthy Paternal Grandmother   . Cancer Paternal  Grandfather     Social History Social History   Tobacco Use  . Smoking status: Never Smoker  . Smokeless tobacco: Never Used  Substance Use Topics  . Alcohol use: Yes    Alcohol/week: 4.2 - 6.0 oz    Types: 7 - 10 Cans of beer per week    Comment: socially  . Drug use: No     Allergies   Patient has no known allergies.   Review of Systems Review of Systems   Physical Exam Triage Vital Signs ED Triage Vitals  Enc Vitals Group     BP 05/05/17 1636 125/77     Pulse Rate 05/05/17 1636 88     Resp --      Temp 05/05/17 1636 98.3 F (36.8 C)     Temp Source 05/05/17 1636 Oral     SpO2 --      Weight --      Height --      Head Circumference --      Peak Flow --      Pain Score 05/05/17 1632 2     Pain Loc --      Pain Edu? --      Excl. in GC? --    No data found.  Updated Vital Signs BP 125/77 (BP Location: Left Arm)   Pulse 88  Temp 98 F (36.7 C) (Oral)   Visual Acuity Right Eye Distance:   Left Eye Distance:   Bilateral Distance:    Right Eye Near:   Left Eye Near:    Bilateral Near:     Physical Exam  Constitutional: He is oriented to person, place, and time. He appears well-developed and well-nourished.  HENT:  Head: Normocephalic and atraumatic.  Right Ear: Tympanic membrane, external ear and ear canal normal.  Left Ear: Tympanic membrane, external ear and ear canal normal.  Nose: Nose normal. Right sinus exhibits no maxillary sinus tenderness and no frontal sinus tenderness. Left sinus exhibits no maxillary sinus tenderness and no frontal sinus tenderness.  Mouth/Throat: Uvula is midline, oropharynx is clear and moist and mucous membranes are normal.  Eyes: Conjunctivae are normal. Pupils are equal, round, and reactive to light.  Neck: Normal range of motion.  Cardiovascular: Normal rate and regular rhythm.  Pulmonary/Chest: Effort normal and breath sounds normal.  Lymphadenopathy:    He has no cervical adenopathy.  Neurological: He is  alert and oriented to person, place, and time.  Skin: Skin is warm and dry.  Vitals reviewed.    UC Treatments / Results  Labs (all labs ordered are listed, but only abnormal results are displayed) Labs Reviewed - No data to display  EKG  EKG Interpretation None       Radiology Dg Chest 2 View  Result Date: 05/05/2017 CLINICAL DATA:  Cough for 2 days.  Fever. EXAM: CHEST  2 VIEW COMPARISON:  None. FINDINGS: There is airspace consolidation in the anterior segment of the left lower lobe. Lungs elsewhere clear. Heart size and pulmonary vascularity are normal. No adenopathy. No bone lesions. IMPRESSION: Airspace consolidation consistent with pneumonia in the anterior segment left lower lobe. Lungs elsewhere clear. No adenopathy. These results will be called to the ordering clinician or representative by the Radiologist Assistant, and communication documented in the PACS or zVision Dashboard. Electronically Signed   By: Bretta Bang III M.D.   On: 05/05/2017 17:43    Procedures Procedures (including critical care time)  Medications Ordered in UC Medications - No data to display   Initial Impression / Assessment and Plan / UC Course  I have reviewed the triage vital signs and the nursing notes.  Pertinent labs & imaging results that were available during my care of the patient were reviewed by me and considered in my medical decision making (see chart for details).   Without acute findings on exam. Lungs clear to auscultation. Non toxic in appearance. Afebrile. Without tachypnea, tachycardia or hypoxia. Xray concerning for left lower lobe pneumonia. Complete course of antibiotics.    Final Clinical Impressions(s) / UC Diagnoses   Final diagnoses:  Community acquired pneumonia of left lower lobe of lung [facility])    ED Discharge Orders        Ordered    azithromycin (ZITHROMAX) 250 MG tablet     05/05/17 1752       Controlled Substance Prescriptions  Controlled  Substance Registry consulted? Not Applicable   Georgetta Haber, NP 05/05/17 1753

## 2017-05-05 NOTE — Discharge Instructions (Addendum)
Push fluids to ensure adequate hydration and keep secretions thin.  °Tylenol and/or ibuprofen as needed for pain or fevers.  °Complete course of antibiotics.  °If symptoms worsen or do not improve in the next week to return to be seen or to follow up with PCP.   °

## 2017-05-07 ENCOUNTER — Ambulatory Visit: Payer: Self-pay

## 2017-05-07 NOTE — Telephone Encounter (Signed)
Pt. called to report intermittent neck pain that started yesterday.  Reported "it is located at the back of neck, where the neck meets my head."  Stated has not been aware of any neck strain or injury. Denied any swelling or inflammation of the neck; denied radiation of the pain into the shoulders or arms.  Stated he has had an intermittent headache. Denied stiffness of the neck.  Reported he was seen in Urgent Care on 05/05/17 and treated for pneumonia; started on an antibiotic.     Temp. @ 8:30 AM is 99.6.  Stated he has been sleeping on the couch, and also has a new "ergonomic chair" that has a pillow for the neck area.  Advised that the different support and positioning of the neck recently, may have contributed to his neck pain.  Home care advice given.  Advised to call back with any concerns or worsening of symptoms.  Verb. Understanding.   Reason for Disposition . Neck pain  or stiffness  Answer Assessment - Initial Assessment Questions 1. ONSET: "When did the pain begin?"      Yesterday 2. LOCATION: "Where does it hurt?"      In back of neck where it meets my head since yesterday 3. PATTERN "Does the pain come and go, or has it been constant since it started?"      Comes and goes 4. SEVERITY: "How bad is the pain?"  (Scale 1-10; or mild, moderate, severe)   - MILD (1-3): doesn't interfere with normal activities    - MODERATE (4-7): interferes with normal activities or awakens from sleep    - SEVERE (8-10):  excruciating pain, unable to do any normal activities      Mild - moderate 5. RADIATION: "Does the pain go anywhere else, shoot into your arms?"     Reports has headache intermittently 6. CORD SYMPTOMS: "Any weakness or numbness of the arms or legs?"     Denied 7. CAUSE: "What do you think is causing the neck pain?"     Unknown 8. NECK OVERUSE: "Any recent activities that involved turning or twisting the neck?"     no 9. OTHER SYMPTOMS: "Do you have any other symptoms?" (e.g.,  headache, fever, chest pain, difficulty breathing, neck swelling)     On antbiotic for pneumonia; low grade fever; Temp. 99.6 at 8:30; denies stiffness in neck 10. PREGNANCY: "Is there any chance you are pregnant?" "When was your last menstrual period?"       n/a  Protocols used: NECK PAIN OR STIFFNESS-A-AH

## 2017-07-19 ENCOUNTER — Encounter: Payer: Self-pay | Admitting: Nurse Practitioner

## 2017-07-19 ENCOUNTER — Ambulatory Visit (INDEPENDENT_AMBULATORY_CARE_PROVIDER_SITE_OTHER): Payer: BLUE CROSS/BLUE SHIELD | Admitting: Nurse Practitioner

## 2017-07-19 VITALS — BP 118/66 | HR 62 | Temp 97.8°F | Resp 16 | Ht 71.0 in | Wt 189.8 lb

## 2017-07-19 DIAGNOSIS — H938X3 Other specified disorders of ear, bilateral: Secondary | ICD-10-CM

## 2017-07-19 MED ORDER — FLUTICASONE PROPIONATE 50 MCG/ACT NA SUSP: 2.0000 | Freq: Every day | NASAL | 6 refills | Status: DC

## 2017-07-19 MED ORDER — PREDNISONE 20 MG PO TABS: 40.0000 mg | ORAL_TABLET | Freq: Every day | ORAL | 0 refills | Status: DC

## 2017-07-19 NOTE — Progress Notes (Signed)
Name: Ricardo Knight   MRN: [id number]    DOB: 12-29-1990   Date:07/19/2017       Progress Note  Subjective  Chief Complaint  Chief Complaint  Patient presents with  . Ear issues    for the past month has had issues with ears feeling blocked and things it could be allergies, also some pain    HPI  Ear problem- This problem began about 1 month ago He complains of fullness and pain to bilateral ears and says that both of his ears "feel blocked'  Sometimes it has been harder for him to hear because of the pressure He has tried mucinex d and claritin a few times over the past month which seems to help temporarily. He reports mild nasal congestion He denies fevers, headaches, otorrhea, sneezing, itchy or watery eyes, sore throat, cough, chest pain, shortness of breath He denies past history of ear infections or ear surgery  Patient Active Problem List   Diagnosis Date Noted  . Anxiety 01/05/2017  . Acute pharyngitis 05/10/2016  . Routine general medical examination at a health care facility 03/15/2016    Social History   Tobacco Use  . Smoking status: Never Smoker  . Smokeless tobacco: Never Used  Substance Use Topics  . Alcohol use: Yes    Alcohol/week: 4.2 - 6.0 oz    Types: 7 - 10 Cans of beer per week    Comment: socially     Current Outpatient Medications:  .  loratadine (CLARITIN) 10 MG tablet, Take 10 mg by mouth daily., Disp: , Rfl:   No Known Allergies  ROS  No other specific complaints in a complete review of systems (except as listed in HPI above).  Objective  Vitals:   07/19/17 0803  BP: 118/66  Pulse: 62  Resp: 16  Temp: 97.8 F (36.6 C)  TempSrc: Oral  SpO2: 98%  Weight: 189 lb 12.8 oz (86.1 kg)  Height: 5\' 11"  (1.803 m)   Body mass index is 26.47 kg/m.  Nursing Note and Vital Signs reviewed.  Physical Exam  Constitutional: Patient appears well-developed and well-nourished.  No distress.  HEENT: head atraumatic, normocephalic, pupils  equal and reactive to light, EOM's intact, TM's without erythema, bilateral TMs bulging with middle ear effusion, no maxillary or frontal sinus tenderness , neck supple , oropharynx pink and moist without exudate Cardiovascular: Normal rate, regular rhythm,  Distal pulses intact Pulmonary/Chest: Effort normal. No respiratory distress or retractions. Neurological: he is alert and oriented to person, place, and time.  Coordination, balance, strength, speech and gait are normal. Hearing intact bilaterally Skin: Skin is warm and dry.  Psychiatric: Patient has a normal mood and affect. behavior is normal. Judgment and thought content normal.  Assessment & Plan   1. Ear pressure, bilateral Rx for flonase and prednisone - dosing and side effects discussed Also discussed continuing the use of OTC antihistamine daily such as claritin or zyrtec -Red flags and when to present for emergency care or RTC including fever >101.60F, chest pain, shortness of breath, new/worsening/un-resolving symptoms,reviewed with patient at time of visit. Follow up and care instructions discussed and provided in AVS. RTC for new or worsening symptoms, may need to consider referral to ENT if problem persists - fluticasone (FLONASE) 50 MCG/ACT nasal spray; Place 2 sprays into both nostrils daily.  Dispense: 16 g; Refill: 6 - predniSONE (DELTASONE) 20 MG tablet; Take 2 tablets (40 mg total) by mouth daily with breakfast.  Dispense: 10 tablet; Refill: 0

## 2017-07-19 NOTE — Patient Instructions (Addendum)
I have sent a prescription for flonase nasal spray to your pharmacy- you may start with 2 sprays in each nostril daily then reduce to 1 spray in each nostril daily when your symptoms improve You may also take an over the counter allergy medication such as claritin or zyrtec for your symptoms. I have also sent a prescription for prednisone 40mg  once daily for 5 days to help with inflammation You can also continue an over the counter allergy medication daily such as claritin or zyrtec.  Follow up if no improvement or your symptoms get worse.  Allergic Rhinitis, Adult Allergic rhinitis is an allergic reaction that affects the mucous membrane inside the nose. It causes sneezing, a runny or stuffy nose, and the feeling of mucus going down the back of the throat (postnasal drip). Allergic rhinitis can be mild to severe. There are two types of allergic rhinitis:  Seasonal. This type is also called hay fever. It happens only during certain seasons.  Perennial. This type can happen at any time of the year.  What are the causes? This condition happens when the body's defense system (immune system) responds to certain harmless substances called allergens as though they were germs.  Seasonal allergic rhinitis is triggered by pollen, which can come from grasses, trees, and weeds. Perennial allergic rhinitis may be caused by:  House dust mites.  Pet dander.  Mold spores.  What are the signs or symptoms? Symptoms of this condition include:  Sneezing.  Runny or stuffy nose (nasal congestion).  Postnasal drip.  Itchy nose.  Tearing of the eyes.  Trouble sleeping.  Daytime sleepiness.  How is this diagnosed? This condition may be diagnosed based on:  Your medical history.  A physical exam.  Tests to check for related conditions, such as: ? Asthma. ? Pink eye. ? Ear infection. ? Upper respiratory infection.  Tests to find out which allergens trigger your symptoms. These may  include skin or blood tests.  How is this treated? There is no cure for this condition, but treatment can help control symptoms. Treatment may include:  Taking medicines that block allergy symptoms, such as antihistamines. Medicine may be given as a shot, nasal spray, or pill.  Avoiding the allergen.  Desensitization. This treatment involves getting ongoing shots until your body becomes less sensitive to the allergen. This treatment may be done if other treatments do not help.  If taking medicine and avoiding the allergen does not work, new, stronger medicines may be prescribed.  Follow these instructions at home:  Find out what you are allergic to. Common allergens include smoke, dust, and pollen.  Avoid the things you are allergic to. These are some things you can do to help avoid allergens: ? Replace carpet with wood, tile, or vinyl flooring. Carpet can trap dander and dust. ? Do not smoke. Do not allow smoking in your home. ? Change your heating and air conditioning filter at least once a month. ? During allergy season:  Keep windows closed as much as possible.  Plan outdoor activities when pollen counts are lowest. This is usually during the evening hours.  When coming indoors, change clothing and shower before sitting on furniture or bedding.  Take over-the-counter and prescription medicines only as told by your health care provider.  Keep all follow-up visits as told by your health care provider. This is important. Contact a health care provider if:  You have a fever.  You develop a persistent cough.  You make whistling sounds when  you breathe (you wheeze).  Your symptoms interfere with your normal daily activities. Get help right away if:  You have shortness of breath. Summary  This condition can be managed by taking medicines as directed and avoiding allergens.  Contact your health care provider if you develop a persistent cough or fever.  During allergy  season, keep windows closed as much as possible. This information is not intended to replace advice given to you by your health care provider. Make sure you discuss any questions you have with your health care provider. Document Released: 12/05/2000 Document Revised: 04/19/2016 Document Reviewed: 04/19/2016 Elsevier Interactive Patient Education  [employer]2018 [employer] Inc.

## 2017-07-30 ENCOUNTER — Telehealth: Payer: Self-pay | Admitting: Nurse Practitioner

## 2017-07-30 NOTE — Telephone Encounter (Signed)
Copied from CRM [id number]. Topic: Quick Communication - See Telephone Encounter >> Jul 30, 2017  4:53 PM Lorrine Kin, NT wrote: CRM for notification. See Telephone encounter for: 07/30/17. Patient calling back and states that he was told that if he was not feeling better to give the office a call. He states that it is a little bit better. His ears still feel very full and blocked. States that he has to do a bunch of flying next week for work and he would like to know something he could take to help this. Please advise. CB#: [phone]

## 2017-07-31 NOTE — Telephone Encounter (Signed)
Please advise 

## 2017-07-31 NOTE — Telephone Encounter (Signed)
He can try to switch his daily antihistamine to xyzal instead of claritin or zyrtec.Continue daily flonase. It may be worth it for him to see ENT to see what other options there are to help with his symptoms, please place referral for the indication of ear fullness if he would like to go to ENT.

## 2017-08-01 NOTE — Telephone Encounter (Signed)
LVM with response below. Asked him to call back if interested in referral to ENT.

## 2018-02-26 ENCOUNTER — Ambulatory Visit (INDEPENDENT_AMBULATORY_CARE_PROVIDER_SITE_OTHER): Payer: BLUE CROSS/BLUE SHIELD | Admitting: Nurse Practitioner

## 2018-02-26 ENCOUNTER — Encounter: Payer: Self-pay | Admitting: Nurse Practitioner

## 2018-02-26 VITALS — BP 130/82 | HR 90 | Ht 71.0 in | Wt 192.0 lb

## 2018-02-26 DIAGNOSIS — Z0000 Encounter for general adult medical examination without abnormal findings: Secondary | ICD-10-CM | POA: Diagnosis not present

## 2018-02-26 DIAGNOSIS — Z114 Encounter for screening for human immunodeficiency virus [HIV]: Secondary | ICD-10-CM

## 2018-02-26 DIAGNOSIS — Z23 Encounter for immunization: Secondary | ICD-10-CM | POA: Diagnosis not present

## 2018-02-26 DIAGNOSIS — Z13220 Encounter for screening for lipoid disorders: Secondary | ICD-10-CM | POA: Diagnosis not present

## 2018-02-26 DIAGNOSIS — Z113 Encounter for screening for infections with a predominantly sexual mode of transmission: Secondary | ICD-10-CM

## 2018-02-26 NOTE — Progress Notes (Signed)
Ricardo Knight is a 27 y.o. male with the following history as recorded in [employer]:  Patient Active Problem List   Diagnosis Date Noted  . Anxiety 01/05/2017  . Acute pharyngitis 05/10/2016  . Routine general medical examination at a health care facility 03/15/2016    Current Outpatient Medications  Medication Sig Dispense Refill  . loratadine (CLARITIN) 10 MG tablet Take 10 mg by mouth daily.     No current facility-administered medications for this visit.     Allergies: Patient has no known allergies.  Past Medical History:  Diagnosis Date  . Anxiety   . Depression   . Exercise-induced asthma   . Heart palpitations   . History of chicken pox     Past Surgical History:  Procedure Laterality Date  . WISDOM TOOTH EXTRACTION      Family History  Problem Relation Age of Onset  . Depression Mother   . Anxiety disorder Mother   . Hypertension Father   . Hyperlipidemia Father   . Anxiety disorder Maternal Grandmother   . Heart attack Maternal Grandmother   . Heart disease Maternal Grandfather   . Hypertension Maternal Grandfather   . Healthy Paternal Grandmother   . Cancer Paternal Grandfather     Social History   Tobacco Use  . Smoking status: Never Smoker  . Smokeless tobacco: Never Used  Substance Use Topics  . Alcohol use: Yes    Alcohol/week: 7.0 - 10.0 standard drinks    Types: 7 - 10 Cans of beer per week    Comment: socially     Subjective:  Here today for CPE-  Last dental exam: 2019- twice yearly dental visits Last vision exam: no routine eye exam Lipids: lipid panel ordered DM screening- not indicated Vaccinations: tdap, flu vacc today  Diet and exercise: tries to eat healthy, counts calories, gym 5x per week   Review of Systems  Constitutional: Negative for chills and fever.  HENT: Negative for hearing loss.   Eyes: Negative for blurred vision and double vision.  Respiratory: Negative for cough and shortness of breath.   Cardiovascular:  Negative for chest pain and palpitations.  Gastrointestinal: Negative for abdominal pain, constipation, diarrhea, heartburn, nausea and vomiting.  Genitourinary: Negative for dysuria, frequency, hematuria and urgency.  Musculoskeletal: Negative for falls.  Skin: Negative for rash.  Neurological: Negative for dizziness, loss of consciousness and weakness.  Endo/Heme/Allergies: Does not bruise/bleed easily.  Psychiatric/Behavioral: Negative for depression. The patient is not nervous/anxious.     Objective:  Vitals:   02/26/18 0937  BP: 130/82  Pulse: 90  SpO2: 97%  Weight: 192 lb (87.1 kg)  Height: 5\' 11"  (1.803 m)    General: Well developed, well nourished, in no acute distress  Skin : Warm and dry.  Head: Normocephalic and atraumatic  Eyes: Sclera and conjunctiva clear; pupils round and reactive to light; extraocular movements intact  Ears: External normal; canals clear; tympanic membranes normal  Oropharynx: Pink, supple. No suspicious lesions  Neck: Supple without thyromegaly, adenopathy  Lungs: Respirations unlabored; clear to auscultation bilaterally without wheeze, rales, rhonchi  CVS exam: normal rate, regular rhythm, normal S1, S2, no murmurs, rubs, clicks or gallops.  Abdomen: Soft; nontender; nondistended; normoactive bowel sounds; no masses or hepatosplenomegaly  Musculoskeletal: No deformities; no active joint inflammation  Extremities: No edema, cyanosis Vessels: Symmetric bilaterally  Neurologic: Alert and oriented; speech intact; face symmetrical; moves all extremities well; CNII-XII intact without focal deficit  Psychiatric: Normal mood and affect.   Assessment:  1. Routine general medical examination at a health care facility   2. Screening for cholesterol level   3. Screening for STD (sexually transmitted disease)   4. Screening for HIV (human immunodeficiency virus)     Plan:   Return in about 1 year (around 02/27/2019) for CPE.  Orders Placed This  Encounter  Procedures  . GC/Chlamydia Probe Amp    Standing Status:   Future    Standing Expiration Date:   02/27/2019  . Comprehensive metabolic panel    Standing Status:   Future    Standing Expiration Date:   02/27/2019  . CBC    Standing Status:   Future    Standing Expiration Date:   02/27/2019  . Lipid panel    Standing Status:   Future    Standing Expiration Date:   02/27/2019  . HIV Antibody (routine testing w rflx)    Standing Status:   Future    Standing Expiration Date:   03/29/2018  . RPR    Standing Status:   Future    Standing Expiration Date:   02/27/2019  . Testosterone    Standing Status:   Future    Standing Expiration Date:   02/26/2019  . Magnesium    Standing Status:   Future    Standing Expiration Date:   04/29/2018    Requested Prescriptions    No prescriptions requested or ordered in this encounter

## 2018-02-26 NOTE — Patient Instructions (Signed)
Please return for fasting labs before 9am   Health Maintenance, Male A healthy lifestyle and preventive care is important for your health and wellness. Ask your health care provider about what schedule of regular examinations is right for you. What should I know about weight and diet? Eat a Healthy Diet  Eat plenty of vegetables, fruits, whole grains, low-fat dairy products, and lean protein.  Do not eat a lot of foods high in solid fats, added sugars, or salt.  Maintain a Healthy Weight Regular exercise can help you achieve or maintain a healthy weight. You should:  Do at least 150 minutes of exercise each week. The exercise should increase your heart rate and make you sweat (moderate-intensity exercise).  Do strength-training exercises at least twice a week.  Watch Your Levels of Cholesterol and Blood Lipids  Have your blood tested for lipids and cholesterol every 5 years starting at 28 years of age. If you are at high risk for heart disease, you should start having your blood tested when you are 28 years old. You may need to have your cholesterol levels checked more often if: ? Your lipid or cholesterol levels are high. ? You are older than 28 years of age. ? You are at high risk for heart disease.  What should I know about cancer screening? Many types of cancers can be detected early and may often be prevented. Lung Cancer  You should be screened every year for lung cancer if: ? You are a current smoker who has smoked for at least 30 years. ? You are a former smoker who has quit within the past 15 years.  Talk to your health care provider about your screening options, when you should start screening, and how often you should be screened.  Colorectal Cancer  Routine colorectal cancer screening usually begins at 28 years of age and should be repeated every 5-10 years until you are 10043 years old. You may need to be screened more often if early forms of precancerous polyps or  small growths are found. Your health care provider may recommend screening at an earlier age if you have risk factors for colon cancer.  Your health care provider may recommend using home test kits to check for hidden blood in the stool.  A small camera at the end of a tube can be used to examine your colon (sigmoidoscopy or colonoscopy). This checks for the earliest forms of colorectal cancer.  Prostate and Testicular Cancer  Depending on your age and overall health, your health care provider may do certain tests to screen for prostate and testicular cancer.  Talk to your health care provider about any symptoms or concerns you have about testicular or prostate cancer.  Skin Cancer  Check your skin from head to toe regularly.  Tell your health care provider about any new moles or changes in moles, especially if: ? There is a change in a mole's size, shape, or color. ? You have a mole that is larger than a pencil eraser.  Always use sunscreen. Apply sunscreen liberally and repeat throughout the day.  Protect yourself by wearing long sleeves, pants, a wide-brimmed hat, and sunglasses when outside.  What should I know about heart disease, diabetes, and high blood pressure?  If you are 7018-28 years of age, have your blood pressure checked every 3-5 years. If you are 28 years of age or older, have your blood pressure checked every year. You should have your blood pressure measured twice-once when you  are at a hospital or clinic, and once when you are not at a hospital or clinic. Record the average of the two measurements. To check your blood pressure when you are not at a hospital or clinic, you can use: ? An automated blood pressure machine at a pharmacy. ? A home blood pressure monitor.  Talk to your health care provider about your target blood pressure.  If you are between 45-79 years old, ask your health care provider if you should take aspirin to prevent heart disease.  Have regular  diabetes screenings by checking your fasting blood sugar level. ? If you are at a normal weight and have a low risk for diabetes, have this test once every three years after the age of 45. ? If you are overweight and have a high risk for diabetes, consider being tested at a younger age or more often.  A one-time screening for abdominal aortic aneurysm (AAA) by ultrasound is recommended for men aged 65-75 years who are current or former smokers. What should I know about preventing infection? Hepatitis B If you have a higher risk for hepatitis B, you should be screened for this virus. Talk with your health care provider to find out if you are at risk for hepatitis B infection. Hepatitis C Blood testing is recommended for:  Everyone born from 1945 through 1965.  Anyone with known risk factors for hepatitis C.  Sexually Transmitted Diseases (STDs)  You should be screened each year for STDs including gonorrhea and chlamydia if: ? You are sexually active and are younger than 28 years of age. ? You are older than 28 years of age and your health care provider tells you that you are at risk for this type of infection. ? Your sexual activity has changed since you were last screened and you are at an increased risk for chlamydia or gonorrhea. Ask your health care provider if you are at risk.  Talk with your health care provider about whether you are at high risk of being infected with HIV. Your health care provider may recommend a prescription medicine to help prevent HIV infection.  What else can I do?  Schedule regular health, dental, and eye exams.  Stay current with your vaccines (immunizations).  Do not use any tobacco products, such as cigarettes, chewing tobacco, and e-cigarettes. If you need help quitting, ask your health care provider.  Limit alcohol intake to no more than 2 drinks per day. One drink equals 12 ounces of beer, 5 ounces of wine, or 1 ounces of hard liquor.  Do not use  street drugs.  Do not share needles.  Ask your health care provider for help if you need support or information about quitting drugs.  Tell your health care provider if you often feel depressed.  Tell your health care provider if you have ever been abused or do not feel safe at home. This information is not intended to replace advice given to you by your health care provider. Make sure you discuss any questions you have with your health care provider. Document Released: 09/08/2007 Document Revised: 11/09/2015 Document Reviewed: 12/14/2014 Elsevier Interactive Patient Education  2018 [employer] Inc.  

## 2018-02-26 NOTE — Assessment & Plan Note (Addendum)
Reviewed annual screening exams, healthy lifestyle, additional information provided on AVS He is requesting STD screening, testosterone, magnesium levels- he will return for all labs before 9am fasting - Comprehensive metabolic panel; Future - CBC; Future - Lipid panel; Future - HIV Antibody (routine testing w rflx); Future - RPR; Future - Testosterone; Future - Magnesium; Future  Screening for cholesterol level - Lipid panel; Future  Screening for STD (sexually transmitted disease) - HIV Antibody (routine testing w rflx); Future - RPR; Future - GC/Chlamydia Probe Amp; Future  Screening for HIV (human immunodeficiency virus) - HIV Antibody (routine testing w rflx); Future

## 2018-02-28 ENCOUNTER — Other Ambulatory Visit (INDEPENDENT_AMBULATORY_CARE_PROVIDER_SITE_OTHER): Payer: BLUE CROSS/BLUE SHIELD

## 2018-02-28 ENCOUNTER — Telehealth: Payer: Self-pay | Admitting: *Deleted

## 2018-02-28 DIAGNOSIS — Z13220 Encounter for screening for lipoid disorders: Secondary | ICD-10-CM | POA: Diagnosis not present

## 2018-02-28 DIAGNOSIS — Z114 Encounter for screening for human immunodeficiency virus [HIV]: Secondary | ICD-10-CM

## 2018-02-28 DIAGNOSIS — Z113 Encounter for screening for infections with a predominantly sexual mode of transmission: Secondary | ICD-10-CM

## 2018-02-28 DIAGNOSIS — Z0000 Encounter for general adult medical examination without abnormal findings: Secondary | ICD-10-CM

## 2018-02-28 LAB — LIPID PANEL
Cholesterol: 166 mg/dL (ref 0–200)
HDL: 58.4 mg/dL (ref >39.00)
LDL Cholesterol: 94 mg/dL (ref 0–99)
NonHDL: 107.7
TRIGLYCERIDES: 68 mg/dL (ref 0.0–149.0)
Total CHOL/HDL Ratio: 3
VLDL: 13.6 mg/dL (ref 0.0–40.0)

## 2018-02-28 LAB — COMPREHENSIVE METABOLIC PANEL
ALT: 23 U/L (ref 0–53)
AST: 22 U/L (ref 0–37)
Albumin: 4.6 g/dL (ref 3.5–5.2)
Alkaline Phosphatase: 52 U/L (ref 39–117)
BUN: 22 mg/dL (ref 6–23)
CO2: 28 mEq/L (ref 19–32)
Calcium: 9.9 mg/dL (ref 8.4–10.5)
Chloride: 102 mEq/L (ref 96–112)
Creatinine, Ser: 0.96 mg/dL (ref 0.40–1.50)
GFR: 99.03 mL/min (ref >60.00)
Glucose, Bld: 91 mg/dL (ref 70–99)
Potassium: 3.5 mEq/L (ref 3.5–5.1)
Sodium: 139 mEq/L (ref 135–145)
Total Bilirubin: 0.6 mg/dL (ref 0.2–1.2)
Total Protein: 7.2 g/dL (ref 6.0–8.3)

## 2018-02-28 LAB — TESTOSTERONE: Testosterone: 435.57 ng/dL (ref 300.00–890.00)

## 2018-02-28 LAB — CBC
HCT: 43 % (ref 39.0–52.0)
Hemoglobin: 14.8 g/dL (ref 13.0–17.0)
MCHC: 34.6 g/dL (ref 30.0–36.0)
MCV: 92.1 fl (ref 78.0–100.0)
Platelets: 171 10*3/uL (ref 150.0–400.0)
RBC: 4.66 Mil/uL (ref 4.22–5.81)
RDW: 12.6 % (ref 11.5–15.5)
WBC: 5.3 10*3/uL (ref 4.0–10.5)

## 2018-02-28 LAB — MAGNESIUM: Magnesium: 2.1 mg/dL (ref 1.5–2.5)

## 2018-02-28 NOTE — Telephone Encounter (Signed)
Copied from CRM [id number]. Topic: General - Inquiry >> Feb 28, 2018  3:05 PM Baldo DaubAlexander, Amber L wrote: Reason for CRM:   Pt states he had lab work done today and was asked to give a urine sample, but states this was not discussed with him in the office.  Pt wants to know if he needs to come back to have that done. Pt can be reached at [phone]

## 2018-03-03 LAB — HIV ANTIBODY (ROUTINE TESTING W REFLEX): HIV: NONREACTIVE

## 2018-03-03 LAB — RPR: RPR Ser Ql: NONREACTIVE

## 2018-03-03 NOTE — Telephone Encounter (Signed)
Response sent in lab result note

## 2018-06-02 ENCOUNTER — Other Ambulatory Visit (INDEPENDENT_AMBULATORY_CARE_PROVIDER_SITE_OTHER): Payer: BLUE CROSS/BLUE SHIELD

## 2018-06-02 ENCOUNTER — Ambulatory Visit (INDEPENDENT_AMBULATORY_CARE_PROVIDER_SITE_OTHER): Payer: BLUE CROSS/BLUE SHIELD | Admitting: Nurse Practitioner

## 2018-06-02 ENCOUNTER — Encounter: Payer: Self-pay | Admitting: Nurse Practitioner

## 2018-06-02 VITALS — BP 110/82 | HR 77 | Ht 71.0 in | Wt 204.0 lb

## 2018-06-02 DIAGNOSIS — L659 Nonscarring hair loss, unspecified: Secondary | ICD-10-CM

## 2018-06-02 LAB — IRON: Iron: 209 ug/dL — ABNORMAL HIGH (ref 42–165)

## 2018-06-02 LAB — TSH: TSH: 1.15 u[IU]/mL (ref 0.35–4.50)

## 2018-06-02 LAB — FERRITIN: Ferritin: 149 ng/mL (ref 22.0–322.0)

## 2018-06-02 NOTE — Progress Notes (Signed)
Ricardo Knight is a 28 y.o. male with the following history as recorded in [employer]:  Patient Active Problem List   Diagnosis Date Noted  . Anxiety 01/05/2017  . Acute pharyngitis 05/10/2016  . Routine general medical examination at a health care facility 03/15/2016    Current Outpatient Medications  Medication Sig Dispense Refill  . loratadine (CLARITIN) 10 MG tablet Take 10 mg by mouth daily.     No current facility-administered medications for this visit.     Allergies: Patient has no known allergies.  Past Medical History:  Diagnosis Date  . Anxiety   . Depression   . Exercise-induced asthma   . Heart palpitations   . History of chicken pox     Past Surgical History:  Procedure Laterality Date  . WISDOM TOOTH EXTRACTION      Family History  Problem Relation Age of Onset  . Depression Mother   . Anxiety disorder Mother   . Hypertension Father   . Hyperlipidemia Father   . Anxiety disorder Maternal Grandmother   . Heart attack Maternal Grandmother   . Heart disease Maternal Grandfather   . Hypertension Maternal Grandfather   . Healthy Paternal Grandmother   . Cancer Paternal Grandfather     Social History   Tobacco Use  . Smoking status: Never Smoker  . Smokeless tobacco: Never Used  Substance Use Topics  . Alcohol use: Yes    Alcohol/week: 7.0 - 10.0 standard drinks    Types: 7 - 10 Cans of beer per week    Comment: socially     Subjective:  Mr Ricardo Knight is here today for acute visit, CC: hair loss. He says over the past 1-2 months he feels like the hair on the top of his head is "thinning." No loss of patches or clumps of hair. No rash, dry skin, skin inflammation, or skin changes He says he overall feels well. Denies fevers, chills, weakness, intolerance to heat or cold, appetite loss, fatigue, rash.  ROS- See HPI  Objective:  Vitals:   06/02/18 1433  BP: 110/82  Pulse: 77  SpO2: 98%  Weight: 204 lb (92.5 kg)  Height: 5\' 11"  (1.803 m)     General: Well developed, well nourished, in no acute distress  Skin : Warm and dry. Hair in normal pattern on head. Head: Normocephalic and atraumatic  Eyes: Sclera and conjunctiva clear; pupils round and reactive to light; extraocular movements intact  Oropharynx: Pink, supple. No suspicious lesions  Neck: Supple Lungs: Respirations unlabored; clear to auscultation bilaterally without wheeze, rales, rhonchi  CVS exam: normal rate and regular rhythm, S1 and S2 normal.  Extremities: No edema, cyanosis, clubbing  Vessels: Symmetric bilaterally  Neurologic: Alert and oriented; speech intact; face symmetrical; moves all extremities well; CNII-XII intact without focal deficit  Psychiatric: Normal mood and affect.  Assessment:  1. Hair loss     Plan:   Labs today F/U with further recommendations pending lab results, could consider referral to dermatology for if labs are WNL  No follow-ups on file.  Orders Placed This Encounter  Procedures  . TSH    Standing Status:   Future    Standing Expiration Date:   06/02/2019  . ANA    Standing Status:   Future    Standing Expiration Date:   06/02/2019  . Iron    Standing Status:   Future    Standing Expiration Date:   06/02/2019  . Ferritin    Standing Status:   Future  Standing Expiration Date:   06/02/2019    Requested Prescriptions    No prescriptions requested or ordered in this encounter

## 2018-06-02 NOTE — Patient Instructions (Addendum)
Head downstairs for labs today, I will let you know when I get the labs back

## 2018-06-04 ENCOUNTER — Other Ambulatory Visit: Payer: Self-pay | Admitting: Nurse Practitioner

## 2018-06-04 DIAGNOSIS — L659 Nonscarring hair loss, unspecified: Secondary | ICD-10-CM

## 2018-06-04 LAB — ANA: Anti Nuclear Antibody(ANA): NEGATIVE

## 2018-06-04 NOTE — Progress Notes (Signed)
referral

## 2018-08-13 ENCOUNTER — Ambulatory Visit: Payer: Self-pay | Admitting: *Deleted

## 2018-08-13 ENCOUNTER — Encounter: Payer: Self-pay | Admitting: Internal Medicine

## 2018-08-13 ENCOUNTER — Ambulatory Visit (INDEPENDENT_AMBULATORY_CARE_PROVIDER_SITE_OTHER): Payer: BLUE CROSS/BLUE SHIELD | Admitting: Internal Medicine

## 2018-08-13 ENCOUNTER — Telehealth: Payer: Self-pay | Admitting: Internal Medicine

## 2018-08-13 DIAGNOSIS — J309 Allergic rhinitis, unspecified: Secondary | ICD-10-CM | POA: Diagnosis not present

## 2018-08-13 DIAGNOSIS — J019 Acute sinusitis, unspecified: Secondary | ICD-10-CM | POA: Diagnosis not present

## 2018-08-13 DIAGNOSIS — F419 Anxiety disorder, unspecified: Secondary | ICD-10-CM

## 2018-08-13 MED ORDER — AZITHROMYCIN 250 MG PO TABS: ORAL_TABLET | ORAL | 0 refills | Status: DC

## 2018-08-13 NOTE — Telephone Encounter (Signed)
Ok, yes, thanks

## 2018-08-13 NOTE — Assessment & Plan Note (Signed)
stable overall by history and exam, recent data reviewed with pt, and pt to continue medical treatment as before,  to f/u any worsening symptoms or concerns  

## 2018-08-13 NOTE — Telephone Encounter (Signed)
FYI  Video visit was scheduled for later today.

## 2018-08-13 NOTE — Telephone Encounter (Signed)
Informed patient

## 2018-08-13 NOTE — Progress Notes (Signed)
Patient ID: Ricardo Knight, male   DOB: May 02, 1990, 28 y.o.   MRN: [id number]  Virtual Visit via Video Note  I connected with [name] on 08/13/18 at  4:00 PM EDT by a video enabled telemedicine application and verified that I am speaking with the correct person using two identifiers.  Location: Patient: at home Provider: at office   I discussed the limitations of evaluation and management by telemedicine and the availability of in person appointments. The patient expressed understanding and agreed to proceed.  History of Present Illness:  Here with 2-3 days acute onset fever, facial pain, pressure, headache, general weakness and malaise, and greenish d/c, with mild ST and cough, but pt denies chest pain, wheezing, increased sob or doe, orthopnea, PND, increased LE swelling, palpitations, dizziness or syncope.  Does have several wks ongoing nasal allergy symptoms with clearish congestion, itch and sneezing, without fever, pain, ST, cough, swelling or wheezing.  Denies worsening depressive symptoms, suicidal ideation, or panic Past Medical History:  Diagnosis Date  . Anxiety   . Depression   . Exercise-induced asthma   . Heart palpitations   . History of chicken pox    Past Surgical History:  Procedure Laterality Date  . WISDOM TOOTH EXTRACTION      reports that he has never smoked. He has never used smokeless tobacco. He reports current alcohol use of about 7.0 - 10.0 standard drinks of alcohol per week. He reports that he does not use drugs. family history includes Anxiety disorder in his maternal grandmother and mother; Cancer in his paternal grandfather; Depression in his mother; Healthy in his paternal grandmother; Heart attack in his maternal grandmother; Heart disease in his maternal grandfather; Hyperlipidemia in his father; Hypertension in his father and maternal grandfather. No Known Allergies Current Outpatient Medications on File Prior to Visit  Medication Sig Dispense  Refill  . loratadine (CLARITIN) 10 MG tablet Take 10 mg by mouth daily.     No current facility-administered medications on file prior to visit.     Observations/Objective: Alert, NAD, appropriate mood and affect, resps normal, cn 2-12 intact, moves all 4s, no visible rash or swelling Lab Results  Component Value Date   WBC 5.3 02/28/2018   HGB 14.8 02/28/2018   HCT 43.0 02/28/2018   PLT 171.0 02/28/2018   GLUCOSE 91 02/28/2018   CHOL 166 02/28/2018   TRIG 68.0 02/28/2018   HDL 58.40 02/28/2018   LDLCALC 94 02/28/2018   ALT 23 02/28/2018   AST 22 02/28/2018   NA 139 02/28/2018   K 3.5 02/28/2018   CL 102 02/28/2018   CREATININE 0.96 02/28/2018   BUN 22 02/28/2018   CO2 28 02/28/2018   TSH 1.15 06/02/2018   Assessment and Plan: See notes  Follow Up Instructions: See notes   I discussed the assessment and treatment plan with the patient. The patient was provided an opportunity to ask questions and all were answered. The patient agreed with the plan and demonstrated an understanding of the instructions.   The patient was advised to call back or seek an in-person evaluation if the symptoms worsen or if the condition fails to improve as anticipated.   Oliver Barre, MD

## 2018-08-13 NOTE — Telephone Encounter (Signed)
Pt reports both ears "With pressure" right > left, onset Sunday.States mild body aches at night only, mild fatigue. Denies fever. States "Runny nose" lightheadedness Sunday only, not presently. No travel, no known exposure.  Pts email and Ph # verified.[place] called practice, [facility], for consideration of appt. Call transferred.  Reason for Disposition . Ear congestion  Answer Assessment - Initial Assessment Questions 1. LOCATION: "Which ear is involved?"       Both right>left 2. SENSATION: "Describe how the ear feels."      pressure 3. ONSET:  "When did the ear symptoms start?"       Sunday 4. PAIN: "Do you also have an earache?" If so, ask: "How bad is it?" (Scale 1-10; or mild, moderate, severe)    mild 5. CAUSE: "What do you think is causing the ear congestion?"     Not sure 6. URI: "Do you have a runny nose or cough?"      Yes, runny nose 7. NASAL ALLERGIES: "Are there symptoms of hay fever, such as sneezing or a clear nasal discharge?"    Body aches, increased fatigue, mild dizziness, not presently.  Protocols used: EAR - CONGESTION-A-AH

## 2018-08-13 NOTE — Patient Instructions (Signed)
Please take all new medication as prescribed - the antibiotic  OK to try otc allegra as needed  Please continue all other medications as before, and refills have been done if requested.  Please have the pharmacy call with any other refills you may need..  Please keep your appointments with your specialists as you may have planned

## 2018-08-13 NOTE — Assessment & Plan Note (Signed)
Ok for [employer] and/or mucinex

## 2018-08-13 NOTE — Assessment & Plan Note (Signed)
Mild to mod, for antibx course,  to f/u any worsening symptoms or concerns 

## 2018-08-13 NOTE — Telephone Encounter (Signed)
Copied from CRM [id number]. Topic: Quick Communication - Rx Refill/Question >> Aug 13, 2018  4:35 PM Jay Schlichter wrote: Medication: azithromycin (ZITHROMAX) 250 MG tablet  Has the patient contacted their pharmacy? Yes.   (Agent: If no, request that the patient contact the pharmacy for the refill.) (Agent: If yes, when and what did the pharmacy advise?)  Preferred Pharmacy (with phone number or street name): [phone] Needs diagnosis code , new restrictions   Agent: Please be advised that RX refills may take up to 3 business days. We ask that you follow-up with your pharmacy.

## 2018-08-13 NOTE — Telephone Encounter (Signed)
Patient is scheduled with you for an acute visit this evening at 4:00pm. He was previously a patient of Ashleigh [facility]bley's. Would you be willing to see him to also transfer care during this appointment?

## 2018-08-14 NOTE — Telephone Encounter (Signed)
Done

## 2018-08-14 NOTE — Telephone Encounter (Signed)
Please advise.   Dx needed.

## 2018-08-14 NOTE — Telephone Encounter (Signed)
Acute upper respinatory infection

## 2018-09-22 DIAGNOSIS — Z03818 Encounter for observation for suspected exposure to other biological agents ruled out: Secondary | ICD-10-CM | POA: Diagnosis not present

## 2018-10-30 DIAGNOSIS — L649 Androgenic alopecia, unspecified: Secondary | ICD-10-CM | POA: Diagnosis not present

## 2019-01-29 ENCOUNTER — Ambulatory Visit (INDEPENDENT_AMBULATORY_CARE_PROVIDER_SITE_OTHER): Payer: BC Managed Care – PPO | Admitting: Internal Medicine

## 2019-01-29 ENCOUNTER — Other Ambulatory Visit: Payer: Self-pay

## 2019-01-29 ENCOUNTER — Encounter: Payer: Self-pay | Admitting: Internal Medicine

## 2019-01-29 ENCOUNTER — Other Ambulatory Visit (INDEPENDENT_AMBULATORY_CARE_PROVIDER_SITE_OTHER): Payer: BC Managed Care – PPO

## 2019-01-29 VITALS — BP 122/76 | HR 75 | Temp 98.2°F | Ht 71.0 in | Wt 185.0 lb

## 2019-01-29 DIAGNOSIS — E611 Iron deficiency: Secondary | ICD-10-CM

## 2019-01-29 DIAGNOSIS — E559 Vitamin D deficiency, unspecified: Secondary | ICD-10-CM

## 2019-01-29 DIAGNOSIS — G5701 Lesion of sciatic nerve, right lower limb: Secondary | ICD-10-CM

## 2019-01-29 DIAGNOSIS — E538 Deficiency of other specified B group vitamins: Secondary | ICD-10-CM | POA: Diagnosis not present

## 2019-01-29 DIAGNOSIS — Z0000 Encounter for general adult medical examination without abnormal findings: Secondary | ICD-10-CM

## 2019-01-29 DIAGNOSIS — Z23 Encounter for immunization: Secondary | ICD-10-CM

## 2019-01-29 LAB — CBC WITH DIFFERENTIAL/PLATELET
Basophils Absolute: 0.1 10*3/uL (ref 0.0–0.1)
Basophils Relative: 1.1 % (ref 0.0–3.0)
Eosinophils Absolute: 0.1 10*3/uL (ref 0.0–0.7)
Eosinophils Relative: 2.4 % (ref 0.0–5.0)
HCT: 42.9 % (ref 39.0–52.0)
Hemoglobin: 14.7 g/dL (ref 13.0–17.0)
Lymphocytes Relative: 30.1 % (ref 12.0–46.0)
Lymphs Abs: 1.5 10*3/uL (ref 0.7–4.0)
MCHC: 34.3 g/dL (ref 30.0–36.0)
MCV: 93.7 fl (ref 78.0–100.0)
Monocytes Absolute: 0.4 10*3/uL (ref 0.1–1.0)
Monocytes Relative: 7.6 % (ref 3.0–12.0)
Neutro Abs: 2.9 10*3/uL (ref 1.4–7.7)
Neutrophils Relative %: 58.8 % (ref 43.0–77.0)
Platelets: 179 10*3/uL (ref 150.0–400.0)
RBC: 4.57 Mil/uL (ref 4.22–5.81)
RDW: 13.1 % (ref 11.5–15.5)
WBC: 4.9 10*3/uL (ref 4.0–10.5)

## 2019-01-29 LAB — TSH: TSH: 0.75 u[IU]/mL (ref 0.35–4.50)

## 2019-01-29 LAB — URINALYSIS, ROUTINE W REFLEX MICROSCOPIC
Bilirubin Urine: NEGATIVE
Hgb urine dipstick: NEGATIVE
Ketones, ur: NEGATIVE
Leukocytes,Ua: NEGATIVE
Nitrite: NEGATIVE
RBC / HPF: NONE SEEN (ref 0–?)
Specific Gravity, Urine: 1.01 (ref 1.000–1.030)
Total Protein, Urine: NEGATIVE
Urine Glucose: NEGATIVE
Urobilinogen, UA: 0.2 (ref 0.0–1.0)
WBC, UA: NONE SEEN (ref 0–?)
pH: 6.5 (ref 5.0–8.0)

## 2019-01-29 LAB — LIPID PANEL
Cholesterol: 178 mg/dL (ref 0–200)
HDL: 61.2 mg/dL (ref >39.00)
LDL Cholesterol: 89 mg/dL (ref 0–99)
NonHDL: 117.14
Total CHOL/HDL Ratio: 3
Triglycerides: 140 mg/dL (ref 0.0–149.0)
VLDL: 28 mg/dL (ref 0.0–40.0)

## 2019-01-29 LAB — BASIC METABOLIC PANEL
BUN: 25 mg/dL — ABNORMAL HIGH (ref 6–23)
CO2: 28 mEq/L (ref 19–32)
Calcium: 9.7 mg/dL (ref 8.4–10.5)
Chloride: 102 mEq/L (ref 96–112)
Creatinine, Ser: 0.89 mg/dL (ref 0.40–1.50)
GFR: 101.02 mL/min (ref >60.00)
Glucose, Bld: 97 mg/dL (ref 70–99)
Potassium: 4 mEq/L (ref 3.5–5.1)
Sodium: 138 mEq/L (ref 135–145)

## 2019-01-29 LAB — HEPATIC FUNCTION PANEL
ALT: 20 U/L (ref 0–53)
AST: 22 U/L (ref 0–37)
Albumin: 4.9 g/dL (ref 3.5–5.2)
Alkaline Phosphatase: 68 U/L (ref 39–117)
Bilirubin, Direct: 0.1 mg/dL (ref 0.0–0.3)
Total Bilirubin: 0.4 mg/dL (ref 0.2–1.2)
Total Protein: 7.5 g/dL (ref 6.0–8.3)

## 2019-01-29 LAB — IBC PANEL
Iron: 136 ug/dL (ref 42–165)
Saturation Ratios: 46.7 % (ref 20.0–50.0)
Transferrin: 208 mg/dL — ABNORMAL LOW (ref 212.0–360.0)

## 2019-01-29 LAB — VITAMIN B12: Vitamin B-12: 810 pg/mL (ref 211–911)

## 2019-01-29 LAB — VITAMIN D 25 HYDROXY (VIT D DEFICIENCY, FRACTURES): VITD: 59.53 ng/mL (ref 30.00–100.00)

## 2019-01-29 MED ORDER — MELOXICAM 15 MG PO TABS: 15.0000 mg | ORAL_TABLET | Freq: Every day | ORAL | 1 refills | Status: DC | PRN

## 2019-01-29 NOTE — Progress Notes (Signed)
Subjective:    Patient ID: Ricardo Knight, male    DOB: 11-Apr-1990, 28 y.o.   MRN: [id number]  HPI  Here for wellness and f/u;  Overall doing ok;  Pt denies Chest pain, worsening SOB, DOE, wheezing, orthopnea, PND, worsening LE edema, palpitations, dizziness or syncope.  Pt denies neurological change such as new headache, facial or extremity weakness.  Pt denies polydipsia, polyuria, or low sugar symptoms. Pt states overall good compliance with treatment and medications, good tolerability, and has been trying to follow appropriate diet.  Pt denies worsening depressive symptoms, suicidal ideation or panic. No fever, night sweats, wt loss, loss of appetite, or other constitutional symptoms.  Pt states good ability with ADL's, has low fall risk, home safety reviewed and adequate, no other significant changes in hearing or vision, and only occasionally active with exercise. Pt continues to have recurring LBP since early 20s requiring extensive PT for several yrs, has hx of pyriformis and tight handstring without bowel or bladder change, fever, wt loss,  worsening LE pain/numbness/weakness, gait change or falls. Past Medical History:  Diagnosis Date  . Anxiety   . Depression   . Exercise-induced asthma   . Heart palpitations   . History of chicken pox    Past Surgical History:  Procedure Laterality Date  . WISDOM TOOTH EXTRACTION      reports that he has never smoked. He has never used smokeless tobacco. He reports current alcohol use of about 7.0 - 10.0 standard drinks of alcohol per week. He reports that he does not use drugs. family history includes Anxiety disorder in his maternal grandmother and mother; Cancer in his paternal grandfather; Depression in his mother; Healthy in his paternal grandmother; Heart attack in his maternal grandmother; Heart disease in his maternal grandfather; Hyperlipidemia in his father; Hypertension in his father and maternal grandfather. No Known Allergies Current  Outpatient Medications on File Prior to Visit  Medication Sig Dispense Refill  . loratadine (CLARITIN) 10 MG tablet Take 10 mg by mouth daily.     No current facility-administered medications on file prior to visit.     Review of Systems Constitutional: Negative for other unusual diaphoresis, sweats, appetite or weight changes HENT: Negative for other worsening hearing loss, ear pain, facial swelling, mouth sores or neck stiffness.   Eyes: Negative for other worsening pain, redness or other visual disturbance.  Respiratory: Negative for other stridor or swelling Cardiovascular: Negative for other palpitations or other chest pain  Gastrointestinal: Negative for worsening diarrhea or loose stools, blood in stool, distention or other pain Genitourinary: Negative for hematuria, flank pain or other change in urine volume.  Musculoskeletal: Negative for myalgias or other joint swelling.  Skin: Negative for other color change, or other wound or worsening drainage.  Neurological: Negative for other syncope or numbness. Hematological: Negative for other adenopathy or swelling Psychiatric/Behavioral: Negative for hallucinations, other worsening agitation, SI, self-injury, or new decreased concentration All otherwise neg per pt     Objective:   Physical Exam BP 122/76   Pulse 75   Temp 98.2 F (36.8 C) (Oral)   Ht 5\' 11"  (1.803 m)   Wt 185 lb (83.9 kg)   SpO2 98%   BMI 25.80 kg/m  VS noted,  Constitutional: Pt is oriented to person, place, and time. Appears well-developed and well-nourished, in no significant distress and comfortable Head: Normocephalic and atraumatic  Eyes: Conjunctivae and EOM are normal. Pupils are equal, round, and reactive to light Right Ear: External ear normal  without discharge Left Ear: External ear normal without discharge Nose: Nose without discharge or deformity Mouth/Throat: Oropharynx is without other ulcerations and moist  Neck: Normal range of motion.  Neck supple. No JVD present. No tracheal deviation present or significant neck LA or mass Cardiovascular: Normal rate, regular rhythm, normal heart sounds and intact distal pulses.   Pulmonary/Chest: WOB normal and breath sounds without rales or wheezing  Abdominal: Soft. Bowel sounds are normal. NT. No HSM  Musculoskeletal: Normal range of motion. Exhibits no edema Lymphadenopathy: Has no other cervical adenopathy.  Neurological: Pt is alert and oriented to person, place, and time. Pt has normal reflexes. No cranial nerve deficit. Motor grossly intact, Gait intact Skin: Skin is warm and dry. No rash noted or new ulcerations Psychiatric:  Has normal mood and affect. Behavior is normal without agitation All otherwise neg per pt Lab Results  Component Value Date   WBC 4.9 01/29/2019   HGB 14.7 01/29/2019   HCT 42.9 01/29/2019   PLT 179.0 01/29/2019   GLUCOSE 97 01/29/2019   CHOL 178 01/29/2019   TRIG 140.0 01/29/2019   HDL 61.20 01/29/2019   LDLCALC 89 01/29/2019   ALT 20 01/29/2019   AST 22 01/29/2019   NA 138 01/29/2019   K 4.0 01/29/2019   CL 102 01/29/2019   CREATININE 0.89 01/29/2019   BUN 25 (H) 01/29/2019   CO2 28 01/29/2019   TSH 0.75 01/29/2019       Assessment & Plan:

## 2019-01-29 NOTE — Patient Instructions (Addendum)
You had the flu shot today  Please take all new medication as prescribed - the anti-inflammatory as needed  You will be contacted regarding the referral for: Dr Tamala Julian in this office for Sports Medicine  Please continue all other medications as before, and refills have been done if requested.  Please have the pharmacy call with any other refills you may need.  Please continue your efforts at being more active, low cholesterol diet, and weight control.  You are otherwise up to date with prevention measures today.  Please keep your appointments with your specialists as you may have planned  Please go to the LAB in the Basement (turn left off the elevator) for the tests to be done today  You will be contacted by phone if any changes need to be made immediately.  Otherwise, you will receive a letter about your results with an explanation, but please check with MyChart first.  Please remember to sign up for MyChart if you have not done so, as this will be important to you in the future with finding out test results, communicating by private email, and scheduling acute appointments online when needed.  Please return in 1 year for your yearly visit, or sooner if needed

## 2019-01-29 NOTE — Assessment & Plan Note (Signed)

## 2019-01-29 NOTE — Assessment & Plan Note (Signed)
For sport med referral 

## 2019-01-30 NOTE — Addendum Note (Signed)
Addended by: Juliet Rude on: 01/30/2019 08:46 AM   Modules accepted: Orders

## 2019-02-05 DIAGNOSIS — L649 Androgenic alopecia, unspecified: Secondary | ICD-10-CM | POA: Diagnosis not present

## 2019-02-09 ENCOUNTER — Ambulatory Visit (INDEPENDENT_AMBULATORY_CARE_PROVIDER_SITE_OTHER): Payer: BC Managed Care – PPO | Admitting: Family Medicine

## 2019-02-09 ENCOUNTER — Encounter: Payer: Self-pay | Admitting: Family Medicine

## 2019-02-09 ENCOUNTER — Other Ambulatory Visit: Payer: Self-pay

## 2019-02-09 VITALS — BP 138/86 | HR 79 | Ht 71.0 in | Wt 191.4 lb

## 2019-02-09 DIAGNOSIS — G5701 Lesion of sciatic nerve, right lower limb: Secondary | ICD-10-CM | POA: Diagnosis not present

## 2019-02-09 DIAGNOSIS — M76891 Other specified enthesopathies of right lower limb, excluding foot: Secondary | ICD-10-CM | POA: Diagnosis not present

## 2019-02-09 NOTE — Progress Notes (Signed)
Subjective:    I'm seeing this patient as a consultation for:  Dr. Jonny Ruiz.  Copy of note will be sent to PCP.  CC: Low back pain and R glute pain  I, Molly Weber, LAT, ATC, am serving as scribe for Dr. [name].  HPI: Pt is a 28 y/o male presenting w/ c/o R glute pain and low back pain.  He has a hx of chronic LBP since his early 5s which resolved after attending PT.  He also reports a chronic issue w/ thightness in his R h/s and piriformis.  Most recently his symptoms started to flare up over the 1.5-2 months w/ no known MOI.  He notes pain is predominantly located in the right buttocks and very rarely is radicular.  Pain is worse with prolonged sitting and with activity.  He reports that he also experiences radiating/radicular pain into the R post. LE inclusive of the post thigh and calf but they don't necessarily happen at the same time.  He denies any numbness/tingling in his R LE.  He uses a standing desk at work.  He has tried self massage w/ a LAX ball, stretching his h/s; TENS; CBD cream; Meloxicam.  Past medical history, Surgical history, Family history not pertinant except as noted below, Social history, Allergies, and medications have been entered into the medical record, reviewed, and no changes needed.   Review of Systems: No headache, visual changes, nausea, vomiting, diarrhea, constipation, dizziness, abdominal pain, skin rash, fevers, chills, night sweats, weight loss, swollen lymph nodes, body aches, joint swelling, muscle aches, chest pain, shortness of breath, mood changes, visual or auditory hallucinations.   Objective:    Vitals:   02/09/19 1453  BP: 138/86  Pulse: 79  SpO2: 97%   General: Well Developed, well nourished, and in no acute distress.  Neuro/Psych: Alert and oriented x3, extra-ocular muscles intact, able to move all 4 extremities, sensation grossly intact. Skin: Warm and dry, no rashes noted.  Respiratory: Not using accessory muscles, speaking in full  sentences, trachea midline.  Cardiovascular: Pulses palpable, no extremity edema. Abdomen: Does not appear distended. MSK:  L-spine: Nontender to spinal midline normal lumbar motion. Right hip: Normal-appearing Normal range of motion with the exception of hip flexion which is slightly reduced by hamstring tightness. Strength: Hip abduction diminished 4/5. Hip external rotation 5/5.  Hip internal rotation and adduction 5/5. Hip is nontender to palpation.  Knee flexion strength intact 5/5.  Left hip: Normal-appearing Normal range of motion. Hip abduction strength 4+/5. Hip external rotation internal rotation and adduction 5/5.  Leg lengths equal bilaterally.  Normal gait.  \  Impression and Recommendations:    Assessment and Plan: 28 y.o. male with right buttocks pain.  Patient has several issues into the right gluteus area.  He has significant hamstring tightness as well as significant weakness to hip abduction.  Pain due to hip abductor tendinitis/weakness as well as hamstring tightness.  Possible piriformis syndrome component as well.  Plan to proceed with trial of physical therapy.  Additionally reviewed home exercises.  Plan to recheck back in 1 month.  If not improving neck step would be x-ray and possibly MRI.  Patient will likely have excellent response to treatment.  PDMP not reviewed this encounter. Orders Placed This Encounter  Procedures  . Ambulatory referral to Physical Therapy    Referral Priority:   Routine    Referral Type:   Physical Medicine    Referral Reason:   Specialty Services Required  Requested Specialty:   Physical Therapy   No orders of the defined types were placed in this encounter.   Discussed warning signs or symptoms. Please see discharge instructions. Patient expresses understanding.  The above documentation has been reviewed and is accurate and complete Lynne Leader

## 2019-02-09 NOTE — Patient Instructions (Addendum)
Thank you for coming in today. Attend PT.  Recheck in about 4 weeks or so.  Return sooner or contact sooner if needed .  Please perform the exercise program that we have prepared for you and gone over in detail on a daily basis.  In addition to the handout you were provided you can access your program through: [email or web address]   Your unique program code is:  [email or web address]  Ricardo Knight moving!  Dr. Clovis Riley new office will be located at [address] on the 1st floor.  This location is across the street from the [employer] and in the same complex as the [facility] and [employer]  Our new office phone number will be [phone].  We anticipate beginning to see patients at the [facility] office in early December 2020.

## 2019-02-13 DIAGNOSIS — Z20828 Contact with and (suspected) exposure to other viral communicable diseases: Secondary | ICD-10-CM | POA: Diagnosis not present

## 2019-02-26 ENCOUNTER — Ambulatory Visit: Payer: BLUE CROSS/BLUE SHIELD | Attending: Family Medicine | Admitting: Physical Therapy

## 2019-02-26 ENCOUNTER — Encounter: Payer: Self-pay | Admitting: Physical Therapy

## 2019-02-26 ENCOUNTER — Other Ambulatory Visit: Payer: Self-pay

## 2019-02-26 DIAGNOSIS — R262 Difficulty in walking, not elsewhere classified: Secondary | ICD-10-CM | POA: Diagnosis not present

## 2019-02-26 DIAGNOSIS — M25551 Pain in right hip: Secondary | ICD-10-CM | POA: Insufficient documentation

## 2019-02-26 DIAGNOSIS — R293 Abnormal posture: Secondary | ICD-10-CM | POA: Diagnosis not present

## 2019-02-27 ENCOUNTER — Telehealth: Payer: Self-pay | Admitting: Family Medicine

## 2019-02-27 ENCOUNTER — Encounter: Payer: Self-pay | Admitting: Physical Therapy

## 2019-02-27 NOTE — Telephone Encounter (Signed)
See note  Copied from [place] [id number]. Topic: General - Other >> Feb 27, 2019  1:57 PM Leward Quan A wrote: Reason for CRM: Patient called to say that he was seen by Dr Amalia Hailey but recommended a longer time for PT per the doctor. Patient states that he had questions for Dr Amalia Hailey nurse and would like a call back at Ph#  [phone]

## 2019-02-27 NOTE — Therapy (Signed)
[place], [place], [address] Phone: [phone]   Fax:  [phone]  Physical Therapy Evaluation  Patient Details  Name: Ricardo Knight MRN: [id number] Date of Birth: 03-21-1991 Referring Provider (PT): Gregor Hams, MD   Encounter Date: 02/26/2019  PT End of Session - 02/26/19 1503    Visit Number  1    Number of Visits  13    Date for PT Re-Evaluation  04/10/19    Authorization Type  BCBS    PT Start Time  1500    PT Stop Time  1027    PT Time Calculation (min)  44 min    Activity Tolerance  Patient tolerated treatment well    Behavior During Therapy  [facility] for tasks assessed/performed       Past Medical History:  Diagnosis Date  . Anxiety   . Depression   . Exercise-induced asthma   . Heart palpitations   . History of chicken pox     Past Surgical History:  Procedure Laterality Date  . WISDOM TOOTH EXTRACTION      There were no vitals filed for this visit.   Subjective Assessment - 02/26/19 1505    Subjective  Even growing up had back pain here and there. 7-8 yr ago got worse and legs would go numb. did some PT and it got a lot worse a few years later and did a lot of PT. It has generally been fine but I know all the right stretches. In the last 2 mo or so rt hip specifically started hurting. Half and rt ankle can hurt to the point of limping. Last 2 weeks are a lot worse.    Currently in Pain?  No/denies    Pain Location  Hip    Pain Orientation  Right;Lateral    Pain Descriptors / Indicators  Sharp    Aggravating Factors   most pain in AM.    Pain Relieving Factors  stretches         [facility] PT Assessment - 02/26/19 0001      Assessment   Medical Diagnosis  piriformis syndrome, tendinitis Rt abductors and hamstrings    Referring Provider (PT)  Gregor Hams, MD    Onset Date/Surgical Date  --   chronic with subacute flare     Precautions   Precautions  None      Restrictions   Weight Bearing Restrictions  No      Balance Screen   Has the patient fallen in the past 6 months  No      [place] residence      Prior Function   Level of Independence  Independent    Vocation Requirements  working from home, has a standing desk      Cognition   Overall Cognitive Status  Within Functional Limits for tasks assessed      Observation/Other Assessments   Focus on Therapeutic Outcomes (FOTO)   48% limited      Sensation   Additional Comments  [facility]      Posture/Postural Control   Posture Comments  sacral sitting      ROM / Strength   AROM / PROM / Strength  AROM      AROM   Overall AROM Comments  limited hip hinge- compensates through lumbar rounding and bending knees      Flexibility   Soft Tissue Assessment /Muscle Length  yes  Hamstrings  Rt 25 deg post MET      Palpation   Palpation comment  TTP TFL, QL                Objective measurements completed on examination: See above findings.      [facility] Adult PT Treatment/Exercise - 02/27/19 0001      Exercises   Exercises  Knee/Hip      Knee/Hip Exercises: Stretches   Passive Hamstring Stretch Limitations  seated EOB LLLD    Other Knee/Hip Stretches  LTR      Knee/Hip Exercises: Supine   Hip Adduction Isometric Limitations  in hooklying    Other Supine Knee/Hip Exercises  isometric Lt hip flexion- used PRN at home             PT Education - 02/27/19 0702    Education Details  anatomy of condition, POC, HEP, exercise form/rationale    Person(s) Educated  Patient    Methods  Explanation;Demonstration;Tactile cues;Verbal cues;Handout    Comprehension  Verbalized understanding;Returned demonstration;Verbal cues required;Tactile cues required;Need further instruction          PT Long Term Goals - 02/27/19 0707      PT LONG TERM GOAL #1   Title  HS flexibility to at least 60 deg    Baseline  25 with pain at eval    Time   6    Period  Weeks    Status  New    Target Date  04/10/19      PT LONG TERM GOAL #2   Title  Pt will be able to sit comfortably on a couch for at least 20 min    Baseline  unable due to pain at eval    Time  6    Period  Weeks    Status  New    Target Date  04/10/19      PT LONG TERM GOAL #3   Title  able to ambulate without limping    Baseline  reports onset of notable limp due to ankle pain in last couple of weeks    Time  6    Period  Weeks    Status  New    Target Date  04/10/19      PT LONG TERM GOAL #4   Title  able to don socks/shoes without limitation    Baseline  painful in last couple of weeks    Time  6    Period  Weeks    Status  New    Target Date  04/10/19      PT LONG TERM GOAL #5   Title  FOTO to 24% limited    Baseline  48% limited at eval    Time  6    Period  Weeks    Status  New    Target Date  04/10/19             Plan - 02/27/19 0654    Clinical Impression Statement  Pt presents to PT with complaints of Chronic Rt hip pain with subacute flare. Notable innominate rotation resulting in functional LLD- able to correct with MET today but Rt leg continued to appear slightly longer. Did not measure today as leg length could still be affected by spasm. Significant shortening of Rt hamstrings- pain with passive SLR to approx 15 deg pre-MET and approx 25 deg post. Tightness noted in TFL and QL on Rt side. Discussed TPDN and will utilize at the  next visit. Pt will benefit from skilled PT in order to address deficits and reach LTGs. Discussed alternating seated/standing within tolerable times, keeping weight distributed equally on feet and to perform core work but to work in a lengthened position (i.e. planks instead of crunches).    Personal Factors and Comorbidities  Comorbidity 1;Time since onset of injury/illness/exacerbation    Comorbidities  h/o Rt tibia spiral fracture    Examination-Activity Limitations  Locomotion Level;Transfers;Bed  Mobility;Bend;Sit;Sleep;Squat;Stairs;Stand    Examination-Participation Restrictions  Cleaning;Driving    Stability/Clinical Decision Making  Evolving/Moderate complexity    Clinical Decision Making  Moderate    Rehab Potential  Good    PT Frequency  2x / week    PT Duration  6 weeks    PT Treatment/Interventions  ADLs/Self Care Home Management;Cryotherapy;Electrical Stimulation;Ultrasound;Traction;Moist Heat;Iontophoresis 17m/ml Dexamethasone;Gait training;Stair training;Functional mobility training;Therapeutic activities;Therapeutic exercise;Balance training;Patient/family education;Neuromuscular re-education;Manual techniques;Passive range of motion;Dry needling;Spinal Manipulations;Taping;Joint Manipulations    PT Next Visit Plan  DN Rt TFL, gluts, HS    PT Home Exercise Plan  iso Lt hip flx PRN, hooklying adduction, seated HSS, LTR    Consulted and Agree with Plan of Care  Patient       Patient will benefit from skilled therapeutic intervention in order to improve the following deficits and impairments:  Decreased range of motion, Difficulty walking, Increased muscle spasms, Decreased activity tolerance, Pain, Improper body mechanics, Impaired flexibility, Postural dysfunction  Visit Diagnosis: Pain in right hip - Plan: PT plan of care cert/re-cert  Difficulty in walking, not elsewhere classified - Plan: PT plan of care cert/re-cert  Abnormal posture - Plan: PT plan of care cert/re-cert     Problem List Patient Active Problem List   Diagnosis Date Noted  . 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 Acute sinus infection 08/13/2018  . Allergic rhinitis 08/13/2018  . Anxiety 01/05/2017  . Acute pharyngitis 05/10/2016  . Routine general medical examination at a health care facility 03/15/2016   Sloka Volante C. Taylan Marez PT, DPT 02/27/19 7:13 AM   [place] [address]Phone: [phone]  Fax:   [phone] Name: NJayme ChamMRN: [id number] of Birth: 107-25-1992

## 2019-03-02 NOTE — Telephone Encounter (Signed)
Called pt and he states that he won't be able to have as much PT as he was hoping prior to his f/u visit so asks if we can r/s to a later date.  R/s pt to early Jan 2021.

## 2019-03-05 ENCOUNTER — Other Ambulatory Visit: Payer: Self-pay

## 2019-03-05 ENCOUNTER — Ambulatory Visit: Payer: BLUE CROSS/BLUE SHIELD | Admitting: Physical Therapy

## 2019-03-05 ENCOUNTER — Encounter: Payer: Self-pay | Admitting: Physical Therapy

## 2019-03-05 DIAGNOSIS — M25551 Pain in right hip: Secondary | ICD-10-CM

## 2019-03-05 DIAGNOSIS — R293 Abnormal posture: Secondary | ICD-10-CM

## 2019-03-05 DIAGNOSIS — R262 Difficulty in walking, not elsewhere classified: Secondary | ICD-10-CM | POA: Diagnosis not present

## 2019-03-05 NOTE — Therapy (Signed)
Ricardo Knight, [place], [address] Phone: [phone]   Fax:  [phone]  Physical Therapy Treatment  Patient Details  Name: Ricardo Knight MRN: [id number] Date of Birth: January 31, 1991 Referring Provider (PT): Gregor Hams, MD   Encounter Date: 03/05/2019  PT End of Session - 03/05/19 1635    Visit Number  2    Number of Visits  13    Date for PT Re-Evaluation  04/10/19    Authorization Type  BCBS    PT Start Time  1634    PT Stop Time  1713    PT Time Calculation (min)  39 min    Activity Tolerance  Patient tolerated treatment well    Behavior During Therapy  [facility] for tasks assessed/performed       Past Medical History:  Diagnosis Date  . Anxiety   . Depression   . Exercise-induced asthma   . Heart palpitations   . History of chicken pox     Past Surgical History:  Procedure Laterality Date  . WISDOM TOOTH EXTRACTION      There were no vitals filed for this visit.  Subjective Assessment - 03/05/19 1636    Subjective  MET was very helpful and did it frequently. pain still got up to about 8/10. having a hard time extending my leg to stretch.    Currently in Pain?  No/denies         [facility] PT Assessment - 03/05/19 0001      Palpation   Palpation comment  Rt LE 93.5cm, Lt 94                   [facility] Adult PT Treatment/Exercise - 03/05/19 0001      Knee/Hip Exercises: Stretches   Other Knee/Hip Stretches  seated figure 4      Manual Therapy   Manual Therapy  Soft tissue mobilization    Manual therapy comments  skilled palpation and monitoring during TPDN    Soft tissue mobilization  Rt TFL       Trigger Point Dry Needling - 03/05/19 0001    Consent Given?  Yes    Education Handout Provided  --   verbal education   Muscles Treated Back/Hip  Tensor fascia lata    Tensor Fascia Lata Response  Twitch response elicited;Palpable increased muscle length   right           PT Education - 03/05/19 1714    Education Details  TPDN & expected outcomes, monitored post needling, heel lift    Person(s) Educated  Patient    Methods  Explanation    Comprehension  Verbalized understanding;Need further instruction          PT Long Term Goals - 02/27/19 0707      PT LONG TERM GOAL #1   Title  HS flexibility to at least 60 deg    Baseline  25 with pain at eval    Time  6    Period  Weeks    Status  New    Target Date  04/10/19      PT LONG TERM GOAL #2   Title  Pt will be able to sit comfortably on a couch for at least 20 min    Baseline  unable due to pain at eval    Time  6    Period  Weeks    Status  New    Target Date  04/10/19  PT LONG TERM GOAL #3   Title  able to ambulate without limping    Baseline  reports onset of notable limp due to ankle pain in last couple of weeks    Time  6    Period  Weeks    Status  New    Target Date  04/10/19      PT LONG TERM GOAL #4   Title  able to don socks/shoes without limitation    Baseline  painful in last couple of weeks    Time  6    Period  Weeks    Status  New    Target Date  04/10/19      PT LONG TERM GOAL #5   Title  FOTO to 24% limited    Baseline  48% limited at eval    Time  6    Period  Weeks    Status  New    Target Date  04/10/19            Plan - 03/05/19 1715    Clinical Impression Statement  placed single layer heel lift in Rt shoe following DN today as he continued to measure shorter after spasm released. Pt felt dizzy after DN and needed a few minutes to lay down. monitored and returned to normal. Asked him to stand with his weight into his heels to avoid "hanging from the Ys posture"   Will likely benefit from DN to hamstrings in order to reduce spasm.    PT Treatment/Interventions  ADLs/Self Care Home Management;Cryotherapy;Electrical Stimulation;Ultrasound;Traction;Moist Heat;Iontophoresis 53m/ml Dexamethasone;Gait training;Stair training;Functional  mobility training;Therapeutic activities;Therapeutic exercise;Balance training;Patient/family education;Neuromuscular re-education;Manual techniques;Passive range of motion;Dry needling;Spinal Manipulations;Taping;Joint Manipulations    PT Next Visit Plan  outcome of Dn? heel lift?    PT Home Exercise Plan  iso Lt hip flx PRN, hooklying adduction, seated HSS, LTR    Consulted and Agree with Plan of Care  Patient       Patient will benefit from skilled therapeutic intervention in order to improve the following deficits and impairments:  Decreased range of motion, Difficulty walking, Increased muscle spasms, Decreased activity tolerance, Pain, Improper body mechanics, Impaired flexibility, Postural dysfunction  Visit Diagnosis: Pain in right hip  Difficulty in walking, not elsewhere classified  Abnormal posture     Problem List Patient Active Problem List   Diagnosis Date Noted  . 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 Acute sinus infection 08/13/2018  . Allergic rhinitis 08/13/2018  . Anxiety 01/05/2017  . Acute pharyngitis 05/10/2016  . Routine general medical examination at a health care facility 03/15/2016   Ricardo Knight PT, DPT 03/05/19 9:13 PM   [facility] [place] [address]Phone: [phone]  Fax:  [phone] Name: Ricardo PhaneufMRN: [id number] of Birth: [birth year]

## 2019-03-09 ENCOUNTER — Ambulatory Visit: Payer: BLUE CROSS/BLUE SHIELD | Admitting: Physical Therapy

## 2019-03-09 ENCOUNTER — Encounter: Payer: Self-pay | Admitting: Physical Therapy

## 2019-03-09 ENCOUNTER — Other Ambulatory Visit: Payer: Self-pay

## 2019-03-09 DIAGNOSIS — M25551 Pain in right hip: Secondary | ICD-10-CM | POA: Diagnosis not present

## 2019-03-09 DIAGNOSIS — R262 Difficulty in walking, not elsewhere classified: Secondary | ICD-10-CM | POA: Diagnosis not present

## 2019-03-09 DIAGNOSIS — R293 Abnormal posture: Secondary | ICD-10-CM

## 2019-03-09 NOTE — Therapy (Signed)
[facility] [address] [place], [place], [address] Phone: [phone]   Fax:  [phone]  Physical Therapy Treatment  Patient Details  Name: Ricardo Knight MRN: [id number] Date of Birth: 09-05-1990 Referring Provider (PT): Rodolph Bong, MD   Encounter Date: 03/09/2019  PT End of Session - 03/09/19 1033    Visit Number  3    Number of Visits  13    Date for PT Re-Evaluation  04/10/19    Authorization Type  BCBS    PT Start Time  1017    PT Stop Time  1057    PT Time Calculation (min)  40 min    Activity Tolerance  Patient tolerated treatment well    Behavior During Therapy  [facility] for tasks assessed/performed       Past Medical History:  Diagnosis Date  . Anxiety   . Depression   . Exercise-induced asthma   . Heart palpitations   . History of chicken pox     Past Surgical History:  Procedure Laterality Date  . WISDOM TOOTH EXTRACTION      There were no vitals filed for this visit.  Subjective Assessment - 03/09/19 1019    Subjective  Pt. reports soreness after last session for 1-2 days then noted improvement this past weekend with increased activity tolerance and positional tolerance for sitting. He tried heel lift but reports this felt awkward for gait mechanics.    Currently in Pain?  Yes    Pain Location  Hip    Pain Orientation  Right;Lateral    Pain Descriptors / Indicators  Sharp    Aggravating Factors   worse in AM    Pain Relieving Factors  stretches         [facility] PT Assessment - 03/09/19 0001      Palpation   SI assessment   no innominate rotation noted                   [facility] Adult PT Treatment/Exercise - 03/09/19 0001      Knee/Hip Exercises: Stretches   Passive Hamstring Stretch  Right;3 reps;30 seconds    Passive Hamstring Stretch Limitations  from "90/90" position in supine    Piriformis Stretch  Right;3 reps;30 seconds    Other Knee/Hip Stretches  Right figure 4 stretch  3x30 sec    Other Knee/Hip Stretches  attempted QL stretch from edge of mat but pt. unable to feel stretch so held      Knee/Hip Exercises: Supine   Bridges with [employer]  AROM;Strengthening;Both;15 reps    Other Supine Knee/Hip Exercises  clamshell green band x 15 reps      Manual Therapy   Soft tissue mobilization  right lateral hip in sidelying, piriformis and hamstring in prone IASTM with roller    Muscle Energy Technique  "shotgun       Trigger Point Dry Needling - 03/09/19 0001    Consent Given?  Yes    Muscles Treated Back/Hip  Gluteus medius;Piriformis;Tensor fascia lata    Dry Needling Comments  needled in left sidelying with 30 gauge 75 mm needles    Electrical Stimulation Performed with Dry Needling  Yes    E-stim with Dry Needling Details  TENS 2 pps x 10 minutes           PT Education - 03/09/19 1101    Education Details  POC, anatomy hpi and SI joint    Person(s) Educated  Patient  Methods  Explanation    Comprehension  Verbalized understanding          PT Long Term Goals - 02/27/19 0707      PT LONG TERM GOAL #1   Title  HS flexibility to at least 60 deg    Baseline  25 with pain at eval    Time  6    Period  Weeks    Status  New    Target Date  04/10/19      PT LONG TERM GOAL #2   Title  Pt will be able to sit comfortably on a couch for at least 20 min    Baseline  unable due to pain at eval    Time  6    Period  Weeks    Status  New    Target Date  04/10/19      PT LONG TERM GOAL #3   Title  able to ambulate without limping    Baseline  reports onset of notable limp due to ankle pain in last couple of weeks    Time  6    Period  Weeks    Status  New    Target Date  04/10/19      PT LONG TERM GOAL #4   Title  able to don socks/shoes without limitation    Baseline  painful in last couple of weeks    Time  6    Period  Weeks    Status  New    Target Date  04/10/19      PT LONG TERM GOAL #5   Title  FOTO to 24% limited     Baseline  48% limited at eval    Time  6    Period  Weeks    Status  New    Target Date  04/10/19            Plan - 03/09/19 1102    Clinical Impression Statement  Still very tight in right hamstring. Mild imrovement from baseline with decreased pain and improved positional + activity tolerance. Trial estim today with dry needling for decreased soreness which was well-tolerated.    Personal Factors and Comorbidities  Comorbidity 1;Time since onset of injury/illness/exacerbation    Comorbidities  h/o Rt tibia spiral fracture    Examination-Activity Limitations  Locomotion Level;Transfers;Bed Mobility;Bend;Sit;Sleep;Squat;Stairs;Stand    Examination-Participation Restrictions  Cleaning;Driving    Stability/Clinical Decision Making  Evolving/Moderate complexity    Clinical Decision Making  Moderate    Rehab Potential  Good    PT Frequency  2x / week    PT Duration  6 weeks    PT Treatment/Interventions  ADLs/Self Care Home Management;Cryotherapy;Electrical Stimulation;Ultrasound;Traction;Moist Heat;Iontophoresis 4mg /ml Dexamethasone;Gait training;Stair training;Functional mobility training;Therapeutic activities;Therapeutic exercise;Balance training;Patient/family education;Neuromuscular re-education;Manual techniques;Passive range of motion;Dry needling;Spinal Manipulations;Taping;Joint Manipulations    PT Next Visit Plan  further dry needling prn, manual, stretches, progress strengthening, possible hamstring eccentrics    PT Home Exercise Plan  iso Lt hip flx PRN, hooklying adduction, seated HSS, LTR    Consulted and Agree with Plan of Care  Patient       Patient will benefit from skilled therapeutic intervention in order to improve the following deficits and impairments:  Decreased range of motion, Difficulty walking, Increased muscle spasms, Decreased activity tolerance, Pain, Improper body mechanics, Impaired flexibility, Postural dysfunction  Visit Diagnosis: Pain in right  hip  Difficulty in walking, not elsewhere classified  Abnormal posture     Problem List Patient  Active Problem List   Diagnosis Date Noted  . 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 Acute sinus infection 08/13/2018  . Allergic rhinitis 08/13/2018  . Anxiety 01/05/2017  . Acute pharyngitis 05/10/2016  . Routine general medical examination at a health care facility 03/15/2016    Beaulah Dinning, PT, DPT 03/09/19 11:05 AM  [place], [place], [address] Phone: [phone]   Fax:  [phone]  Name: Cordon Gassett MRN: [id number] Date of Birth: 15-Mar-1991

## 2019-03-11 ENCOUNTER — Ambulatory Visit: Payer: BC Managed Care – PPO | Admitting: Family Medicine

## 2019-03-12 ENCOUNTER — Ambulatory Visit: Payer: BLUE CROSS/BLUE SHIELD | Attending: Internal Medicine

## 2019-03-12 ENCOUNTER — Encounter: Payer: Self-pay | Admitting: Physical Therapy

## 2019-03-12 ENCOUNTER — Ambulatory Visit: Payer: BLUE CROSS/BLUE SHIELD | Admitting: Physical Therapy

## 2019-03-12 ENCOUNTER — Other Ambulatory Visit: Payer: Self-pay

## 2019-03-12 DIAGNOSIS — M25551 Pain in right hip: Secondary | ICD-10-CM

## 2019-03-12 DIAGNOSIS — R293 Abnormal posture: Secondary | ICD-10-CM

## 2019-03-12 DIAGNOSIS — R262 Difficulty in walking, not elsewhere classified: Secondary | ICD-10-CM | POA: Diagnosis not present

## 2019-03-12 DIAGNOSIS — Z20828 Contact with and (suspected) exposure to other viral communicable diseases: Secondary | ICD-10-CM | POA: Diagnosis not present

## 2019-03-12 DIAGNOSIS — Z20822 Contact with and (suspected) exposure to covid-19: Secondary | ICD-10-CM

## 2019-03-12 NOTE — Therapy (Signed)
[place], [place], [address] Phone: [phone]   Fax:  [phone]  Physical Therapy Treatment  Patient Details  Name: Ricardo Knight MRN: [id number] Date of Birth: [birth year] Referring Provider (PT): Gregor Hams, MD   Encounter Date: 03/12/2019  PT End of Session - 03/12/19 0821    Visit Number  4    Number of Visits  13    Date for PT Re-Evaluation  04/10/19    Authorization Type  BCBS    PT Start Time  0805    PT Stop Time  0844    PT Time Calculation (min)  39 min    Activity Tolerance  Patient tolerated treatment well    Behavior During Therapy  [facility] for tasks assessed/performed       Past Medical History:  Diagnosis Date  . Anxiety   . Depression   . Exercise-induced asthma   . Heart palpitations   . History of chicken pox     Past Surgical History:  Procedure Laterality Date  . WISDOM TOOTH EXTRACTION      There were no vitals filed for this visit.  Subjective Assessment - 03/12/19 0820    Subjective  Pt. reports some tightness this AM but reports last session helped with symptoms and he has had less pain the past few days. Primary symptoms right posterolateral hip region and noting some discomfort intermittently in lateral proximal calf.    Currently in Pain?  Yes    Pain Location  Hip    Pain Orientation  Right;Lateral    Pain Descriptors / Indicators  Sharp    Aggravating Factors   worse in AM    Pain Relieving Factors  stretches         [facility] PT Assessment - 03/12/19 0001      Flexibility   Hamstrings  --   SLR 25 deg with pain     Palpation   SI assessment   no innominate rotation noted                   [facility] Adult PT Treatment/Exercise - 03/12/19 0001      Knee/Hip Exercises: Stretches   Passive Hamstring Stretch  Right;3 reps;30 seconds    Passive Hamstring Stretch Limitations  from "90/90" position in supine    Piriformis Stretch  Right;3  reps;30 seconds    Other Knee/Hip Stretches  --      Knee/Hip Exercises: Supine   Bridges with [employer]  AROM;Strengthening;Both;20 reps    Other Supine Knee/Hip Exercises  clamshell with blue band x 20 reps      Manual Therapy   Manual Therapy  Joint mobilization    Joint Mobilization  Right hip LAD oscillations grade I-III    Soft tissue mobilization  right lateral hip in sidelying, piriformis and hamstring in prone IASTM with roller, prone piriformis release with passive hip ER    Muscle Energy Technique  --       Trigger Point Dry Needling - 03/12/19 0001    Consent Given?  Yes    Muscles Treated Back/Hip  Gluteus medius;Piriformis;Tensor fascia lata    Dry Needling Comments  needled in left sidelying with 30 gauge 75 mm needles    Electrical Stimulation Performed with Dry Needling  Yes    E-stim with Dry Needling Details  TENS 2 pps x 10 minutes    Gluteus Medius Response  Twitch response elicited    Piriformis Response  Twitch response elicited           PT Education - 03/12/19 0821    Education Details  POC    Person(s) Educated  Patient    Methods  Explanation    Comprehension  Verbalized understanding          PT Long Term Goals - 03/12/19 [id number]      PT LONG TERM GOAL #1   Title  HS flexibility to at least 60 deg    Baseline  still 25 deg with pain    Time  6    Period  Weeks    Status  On-going      PT LONG TERM GOAL #2   Title  Pt will be able to sit comfortably on a couch for at least 20 min    Baseline  able later in the day but not in AM    Time  6    Period  Weeks    Status  On-going      PT LONG TERM GOAL #3   Title  able to ambulate without limping    Baseline  intermittently still limping but improving from baseline    Time  6    Period  Weeks    Status  On-going      PT LONG TERM GOAL #4   Title  able to don socks/shoes without limitation    Baseline  painful in last couple of weeks    Time  6    Period  Weeks    Status   On-going      PT LONG TERM GOAL #5   Title  FOTO to 24% limited    Baseline  48% limited at eval    Time  6    Period  Weeks    Status  On-going            Plan - 03/12/19 0844    Clinical Impression Statement  Still very tight in right hamstring but improving with decreased hip tightness/pain and making progress with therapy goals with assoictaed functional gains for positional tolerance.    Personal Factors and Comorbidities  Comorbidity 1;Time since onset of injury/illness/exacerbation    Comorbidities  h/o Rt tibia spiral fracture    Examination-Activity Limitations  Locomotion Level;Transfers;Bed Mobility;Bend;Sit;Sleep;Squat;Stairs;Stand    Examination-Participation Restrictions  Cleaning;Driving    Stability/Clinical Decision Making  Evolving/Moderate complexity    Clinical Decision Making  Moderate    Rehab Potential  Good    PT Frequency  2x / week    PT Duration  6 weeks    PT Treatment/Interventions  ADLs/Self Care Home Management;Cryotherapy;Electrical Stimulation;Ultrasound;Traction;Moist Heat;Iontophoresis 4mg /ml Dexamethasone;Gait training;Stair training;Functional mobility training;Therapeutic activities;Therapeutic exercise;Balance training;Patient/family education;Neuromuscular re-education;Manual techniques;Passive range of motion;Dry needling;Spinal Manipulations;Taping;Joint Manipulations    PT Next Visit Plan  further dry needling prn, manual, stretches, progress strengthening, possible hamstring eccentrics    PT Home Exercise Plan  iso Lt hip flx PRN, hooklying adduction, seated HSS, LTR    Consulted and Agree with Plan of Care  Patient       Patient will benefit from skilled therapeutic intervention in order to improve the following deficits and impairments:  Decreased range of motion, Difficulty walking, Increased muscle spasms, Decreased activity tolerance, Pain, Improper body mechanics, Impaired flexibility, Postural dysfunction  Visit Diagnosis: Pain  in right hip  Difficulty in walking, not elsewhere classified  Abnormal posture     Problem List Patient Active Problem List   Diagnosis Date Noted  .  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 Acute sinus infection 08/13/2018  . Allergic rhinitis 08/13/2018  . Anxiety 01/05/2017  . Acute pharyngitis 05/10/2016  . Routine general medical examination at a health care facility 03/15/2016    Lazarus Gowda, PT, DPT 03/12/19 8:46 AM  [facility] [address] [place], [place], [address] Phone: [phone]   Fax:  [phone]  Name: Naethan Bracewell MRN: [id number] Date of Birth: [birth year]

## 2019-03-14 LAB — NOVEL CORONAVIRUS, NAA: SARS-CoV-2, NAA: NOT DETECTED

## 2019-03-16 ENCOUNTER — Encounter: Payer: Self-pay | Admitting: Physical Therapy

## 2019-03-16 ENCOUNTER — Other Ambulatory Visit: Payer: Self-pay

## 2019-03-16 ENCOUNTER — Ambulatory Visit: Payer: BLUE CROSS/BLUE SHIELD | Admitting: Physical Therapy

## 2019-03-16 DIAGNOSIS — M25551 Pain in right hip: Secondary | ICD-10-CM | POA: Diagnosis not present

## 2019-03-16 DIAGNOSIS — R262 Difficulty in walking, not elsewhere classified: Secondary | ICD-10-CM | POA: Diagnosis not present

## 2019-03-16 DIAGNOSIS — R293 Abnormal posture: Secondary | ICD-10-CM

## 2019-03-16 NOTE — Therapy (Signed)
[place], [place], [address] Phone: [phone]   Fax:  [phone]  Physical Therapy Treatment  Patient Details  Name: Ricardo Knight MRN: [id number] Date of Birth: [birth year] Referring Provider (PT): Ricardo Hams, MD   Encounter Date: 03/16/2019  PT End of Session - 03/16/19 0809    Visit Number  5    Number of Visits  13    Date for PT Re-Evaluation  04/10/19    Authorization Type  BCBS    PT Start Time  0806   pt arrived late   PT Stop Time  0844    PT Time Calculation (min)  38 min    Activity Tolerance  Patient tolerated treatment well    Behavior During Therapy  [facility] for tasks assessed/performed       Past Medical History:  Diagnosis Date  . Anxiety   . Depression   . Exercise-induced asthma   . Heart palpitations   . History of chicken pox     Past Surgical History:  Procedure Laterality Date  . WISDOM TOOTH EXTRACTION      There were no vitals filed for this visit.  Subjective Assessment - 03/16/19 0810    Subjective  soreness is better.    Currently in Pain?  Yes    Pain Orientation  Right;Lateral    Pain Descriptors / Indicators  Simonne Martinet PT Assessment - 03/16/19 0001      Assessment   Medical Diagnosis  piriformis syndrome, tendinitis Rt abductors and hamstrings    Referring Provider (PT)  Ricardo Hams, MD                   [facility] Adult PT Treatment/Exercise - 03/16/19 0001      Knee/Hip Exercises: Stretches   Passive Hamstring Stretch Limitations  seated ankle punps    Gastroc Stretch  Right;Left;30 seconds    Soleus Stretch  Right;Left;30 seconds    Other Knee/Hip Stretches  supine 90/90 with ankle pumps      Knee/Hip Exercises: Aerobic   Elliptical  5 min L1 ramp4      Knee/Hip Exercises: Supine   Bridges Limitations  over physioball with cues for dissociation    Other Supine Knee/Hip Exercises  90/90 over physioball with pelvic  tilts    Other Supine Knee/Hip Exercises  LTR legs over physioball      Knee/Hip Exercises: Prone   Hamstring Curl Limitations  eccentric 2lb    Other Prone Exercises  plank toes & elbows      Manual Therapy   Soft tissue mobilization  roller Rt hip                  PT Long Term Goals - 03/12/19 0093      PT LONG TERM GOAL #1   Title  HS flexibility to at least 60 deg    Baseline  still 25 deg with pain    Time  6    Period  Weeks    Status  On-going      PT LONG TERM GOAL #2   Title  Pt will be able to sit comfortably on a couch for at least 20 min    Baseline  able later in the day but not in AM    Time  6    Period  Weeks    Status  On-going  PT LONG TERM GOAL #3   Title  able to ambulate without limping    Baseline  intermittently still limping but improving from baseline    Time  6    Period  Weeks    Status  On-going      PT LONG TERM GOAL #4   Title  able to don socks/shoes without limitation    Baseline  painful in last couple of weeks    Time  6    Period  Weeks    Status  On-going      PT LONG TERM GOAL #5   Title  FOTO to 24% limited    Baseline  48% limited at eval    Time  6    Period  Weeks    Status  On-going            Plan - 03/16/19 1106    Clinical Impression Statement  Significant difficulty dissociating motion of pelvis and was addressed today. Hamstring stretches/nerve glides altered: seated HSS with PF/DF and 90/90 stretch with ankle pumps at end range. Plank form reviewed and added to HEP. Mild discomfort in Rt side planks decreased with roller.    PT Treatment/Interventions  ADLs/Self Care Home Management;Cryotherapy;Electrical Stimulation;Ultrasound;Traction;Moist Heat;Iontophoresis 4mg /ml Dexamethasone;Gait training;Stair training;Functional mobility training;Therapeutic activities;Therapeutic exercise;Balance training;Patient/family education;Neuromuscular re-education;Manual techniques;Passive range of motion;Dry  needling;Spinal Manipulations;Taping;Joint Manipulations    PT Next Visit Plan  DN PRN, hip hinge- qped & standing    PT Home Exercise Plan  iso Lt hip flx PRN, hooklying adduction, seated HSS, LTR, planks    Consulted and Agree with Plan of Care  Patient       Patient will benefit from skilled therapeutic intervention in order to improve the following deficits and impairments:  Decreased range of motion, Difficulty walking, Increased muscle spasms, Decreased activity tolerance, Pain, Improper body mechanics, Impaired flexibility, Postural dysfunction  Visit Diagnosis: Pain in right hip  Difficulty in walking, not elsewhere classified  Abnormal posture     Problem List Patient Active Problem List   Diagnosis Date Noted  . 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 Acute sinus infection 08/13/2018  . Allergic rhinitis 08/13/2018  . Anxiety 01/05/2017  . Acute pharyngitis 05/10/2016  . Routine general medical examination at a health care facility 03/15/2016    Ricardo Knight C. Ricardo Knight PT, DPT 03/16/19 11:09 AM   [facility] [address] [place], [place], [place] Phone: [phone]   Fax:  [phone]  Name: Ricardo Knight MRN: [name] Date of Birth: 24-Apr-1990

## 2019-03-25 ENCOUNTER — Encounter: Payer: Self-pay | Admitting: Physical Therapy

## 2019-03-25 ENCOUNTER — Ambulatory Visit: Payer: BLUE CROSS/BLUE SHIELD | Admitting: Physical Therapy

## 2019-03-25 ENCOUNTER — Other Ambulatory Visit: Payer: Self-pay

## 2019-03-25 DIAGNOSIS — R293 Abnormal posture: Secondary | ICD-10-CM

## 2019-03-25 DIAGNOSIS — M25551 Pain in right hip: Secondary | ICD-10-CM | POA: Diagnosis not present

## 2019-03-25 DIAGNOSIS — R262 Difficulty in walking, not elsewhere classified: Secondary | ICD-10-CM | POA: Diagnosis not present

## 2019-03-25 NOTE — Therapy (Signed)
[facility] [address] [place], [place], [address] Phone: [phone]   Fax:  [phone]  Physical Therapy Treatment  Patient Details  Name: Ricardo Knight MRN: [id number] Date of Birth: 1990/04/09 Referring Provider (PT): Rodolph Bong, MD   Encounter Date: 03/25/2019  PT End of Session - 03/25/19 1006    Visit Number  6    Number of Visits  13    Date for PT Re-Evaluation  04/10/19    Authorization Type  BCBS    PT Start Time  [id number]   pt. arrived late   PT Stop Time  1015    PT Time Calculation (min)  36 min    Activity Tolerance  Patient tolerated treatment well    Behavior During Therapy  [facility] for tasks assessed/performed       Past Medical History:  Diagnosis Date  . Anxiety   . Depression   . Exercise-induced asthma   . Heart palpitations   . History of chicken pox     Past Surgical History:  Procedure Laterality Date  . WISDOM TOOTH EXTRACTION      There were no vitals filed for this visit.  Subjective Assessment - 03/25/19 0944    Subjective  Still feels like overall improving but having pain in right hip and posterior knee, proximal calf region.    Currently in Pain?  Yes    Pain Score  4     Pain Location  Hip    Pain Orientation  Right;Lateral    Pain Descriptors / Indicators  Dull    Pain Frequency  Intermittent    Aggravating Factors   worse in AM and with sitting    Pain Relieving Factors  stretches                       [facility] Adult PT Treatment/Exercise - 03/25/19 0001      Knee/Hip Exercises: Stretches   Passive Hamstring Stretch  Right;3 reps;30 seconds    Passive Hamstring Stretch Limitations  from 90/90 position    Piriformis Stretch  Right;3 reps;30 seconds    Other Knee/Hip Stretches  prone on elbows x 2 min, prone press up x10      Manual Therapy   Manual Therapy  Neural Stretch    Soft tissue mobilization  roller right posterolateral hip and hamstring     Neural Stretch  Right leg nerve floss x 30 ea. hip flexion, knee flexion and ankle PF in supine       Trigger Point Dry Needling - 03/25/19 0001    Consent Given?  Yes    Muscles Treated Back/Hip  Gluteus medius;Piriformis;Tensor fascia lata    Dry Needling Comments  needled in left sidelying with 30 gauge 75 mm needles    Electrical Stimulation Performed with Dry Needling  Yes    E-stim with Dry Needling Details  TENS 2 pps x 8 min    Gluteus Medius Response  Twitch response elicited    Piriformis Response  Twitch response elicited           PT Education - 03/25/19 1006    Education Details  HEP, POC    Person(s) Educated  Patient    Methods  Explanation;Verbal cues    Comprehension  Verbalized understanding          PT Long Term Goals - 03/12/19 0823      PT LONG TERM GOAL #1   Title  HS  flexibility to at least 60 deg    Baseline  still 25 deg with pain    Time  6    Period  Weeks    Status  On-going      PT LONG TERM GOAL #2   Title  Pt will be able to sit comfortably on a couch for at least 20 min    Baseline  able later in the day but not in AM    Time  6    Period  Weeks    Status  On-going      PT LONG TERM GOAL #3   Title  able to ambulate without limping    Baseline  intermittently still limping but improving from baseline    Time  6    Period  Weeks    Status  On-going      PT LONG TERM GOAL #4   Title  able to don socks/shoes without limitation    Baseline  painful in last couple of weeks    Time  6    Period  Weeks    Status  On-going      PT LONG TERM GOAL #5   Title  FOTO to 24% limited    Baseline  48% limited at eval    Time  6    Period  Weeks    Status  On-going            Plan - 03/25/19 1007    Clinical Impression Statement  Added RLE nerve stretches to address neural tension component of limited/(+) SLR. Also added trial lumbar extension ROM to centralize symptoms-limited response during tx. but added to HEP and will  await further response by next week.    Personal Factors and Comorbidities  Comorbidity 1;Time since onset of injury/illness/exacerbation    Comorbidities  h/o Rt tibia spiral fracture    Examination-Activity Limitations  Locomotion Level;Transfers;Bed Mobility;Bend;Sit;Sleep;Squat;Stairs;Stand    Examination-Participation Restrictions  Cleaning;Driving    Stability/Clinical Decision Making  Evolving/Moderate complexity    Clinical Decision Making  Moderate    Rehab Potential  Good    PT Frequency  2x / week    PT Duration  6 weeks    PT Treatment/Interventions  ADLs/Self Care Home Management;Cryotherapy;Electrical Stimulation;Ultrasound;Traction;Moist Heat;Iontophoresis 4mg /ml Dexamethasone;Gait training;Stair training;Functional mobility training;Therapeutic activities;Therapeutic exercise;Balance training;Patient/family education;Neuromuscular re-education;Manual techniques;Passive range of motion;Dry needling;Spinal Manipulations;Taping;Joint Manipulations    PT Next Visit Plan  Continue dry needling prn, check response extension exercises and nerve glides, continue stretches and strengthening, quadruped exercises    PT Home Exercise Plan  iso Lt hip flx PRN, hooklying adduction, seated HSS, LTR, planks    Consulted and Agree with Plan of Care  Patient       Patient will benefit from skilled therapeutic intervention in order to improve the following deficits and impairments:  Decreased range of motion, Difficulty walking, Increased muscle spasms, Decreased activity tolerance, Pain, Improper body mechanics, Impaired flexibility, Postural dysfunction  Visit Diagnosis: Pain in right hip  Difficulty in walking, not elsewhere classified  Abnormal posture     Problem List Patient Active Problem List   Diagnosis Date Noted  . 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 Acute sinus infection 08/13/2018  . Allergic rhinitis 08/13/2018  . Anxiety 01/05/2017  . Acute pharyngitis 05/10/2016   . Routine general medical examination at a health care facility 03/15/2016    Beaulah Dinning, PT, DPT 03/25/19 10:11 AM  [place],  [place], [id number] Phone: [phone]   Fax:  [phone]  Name: Ricardo Knight MRN: [id number] Date of Birth: 1990-12-06

## 2019-03-26 ENCOUNTER — Encounter

## 2019-03-30 ENCOUNTER — Other Ambulatory Visit: Payer: Self-pay

## 2019-03-30 ENCOUNTER — Ambulatory Visit: Payer: BLUE CROSS/BLUE SHIELD | Attending: Family Medicine | Admitting: Physical Therapy

## 2019-03-30 ENCOUNTER — Encounter: Payer: Self-pay | Admitting: Physical Therapy

## 2019-03-30 DIAGNOSIS — R262 Difficulty in walking, not elsewhere classified: Secondary | ICD-10-CM | POA: Diagnosis not present

## 2019-03-30 DIAGNOSIS — R293 Abnormal posture: Secondary | ICD-10-CM | POA: Diagnosis not present

## 2019-03-30 DIAGNOSIS — M25551 Pain in right hip: Secondary | ICD-10-CM | POA: Insufficient documentation

## 2019-03-30 NOTE — Therapy (Signed)
[facility] [address] [place], [place], [address] Phone: [phone]   Fax:  [phone]  Physical Therapy Treatment  Patient Details  Name: Ricardo Knight MRN: [id number] Date of Birth: Jan 18, 1991 Referring Provider (PT): Rodolph Bong, MD   Encounter Date: 03/30/2019  PT End of Session - 03/30/19 0806    Visit Number  7    Number of Visits  13    Date for PT Re-Evaluation  04/10/19    Authorization Type  BCBS    PT Start Time  [phone]   pt. arrived a few minutes late   PT Stop Time  0845    PT Time Calculation (min)  38 min    Activity Tolerance  Patient tolerated treatment well    Behavior During Therapy  [facility] for tasks assessed/performed       Past Medical History:  Diagnosis Date  . Anxiety   . Depression   . Exercise-induced asthma   . Heart palpitations   . History of chicken pox     Past Surgical History:  Procedure Laterality Date  . WISDOM TOOTH EXTRACTION      There were no vitals filed for this visit.  Subjective Assessment - 03/30/19 0806    Subjective  Pt. reports less calf pain with doing extension exercises. Feels like he overstretched hip over the weekend so pain in hip this AM.    Pain Score  6     Pain Location  Hip    Pain Orientation  Right;Lateral    Pain Descriptors / Indicators  Dull    Pain Frequency  Intermittent    Aggravating Factors   wors ein AM and with sitting    Pain Relieving Factors  stretches, extension                       [facility] Adult PT Treatment/Exercise - 03/30/19 0001      Knee/Hip Exercises: Stretches   Passive Hamstring Stretch  Right;3 reps;30 seconds    Passive Hamstring Stretch Limitations  from 90/90 position    Piriformis Stretch  Right;3 reps;30 seconds    Piriformis Stretch Limitations  supine manual stretch      Knee/Hip Exercises: Standing   Other Standing Knee Exercises  lumbar extension in standing with belt support 2x10       Knee/Hip Exercises: Seated   Other Seated Knee/Hip Exercises  seated nerve floss: knee extension with ankle PF, knee flexion with ankle DF 2x10      Knee/Hip Exercises: Prone   Hip Extension  AROM;Strengthening;Right;Left;1 set;15 reps    Other Prone Exercises  prone alt. hip ext x 10 ea. bilat.      Manual Therapy   Joint Mobilization  R hip LAD grade I-III oscillations    Soft tissue mobilization  roller right posterolateral hip and hamstring, prone release right piriformis with passive hip ER       Trigger Point Dry Needling - 03/30/19 0001    Consent Given?  Yes    Muscles Treated Back/Hip  Gluteus medius;Piriformis;Tensor fascia lata    Dry Needling Comments  needled in left sidelying with 30 gauge 75 mm needles    Electrical Stimulation Performed with Dry Needling  Yes    E-stim with Dry Needling Details  TENS 2 pps x 8 min    Gluteus Medius Response  Twitch response elicited    Piriformis Response  Twitch response elicited  PT Education - 03/30/19 0824    Education Details  ergonomics for showering    Person(s) Educated  Patient    Methods  Explanation    Comprehension  Verbalized understanding          PT Long Term Goals - 03/12/19 0823      PT LONG TERM GOAL #1   Title  HS flexibility to at least 60 deg    Baseline  still 25 deg with pain    Time  6    Period  Weeks    Status  On-going      PT LONG TERM GOAL #2   Title  Pt will be able to sit comfortably on a couch for at least 20 min    Baseline  able later in the day but not in AM    Time  6    Period  Weeks    Status  On-going      PT LONG TERM GOAL #3   Title  able to ambulate without limping    Baseline  intermittently still limping but improving from baseline    Time  6    Period  Weeks    Status  On-going      PT LONG TERM GOAL #4   Title  able to don socks/shoes without limitation    Baseline  painful in last couple of weeks    Time  6    Period  Weeks    Status  On-going       PT LONG TERM GOAL #5   Title  FOTO to 24% limited    Baseline  48% limited at eval    Time  6    Period  Weeks    Status  On-going            Plan - 03/30/19 1572    Clinical Impression Statement  Good response addition extension bias trunk ROM and nerve stretches to centralize symptoms. Progress exercises with extension bias core strengthening with good tolerance. Fair progress though with local hip symptoms given continued pain.    Personal Factors and Comorbidities  Comorbidity 1;Time since onset of injury/illness/exacerbation    Comorbidities  h/o Rt tibia spiral fracture    Examination-Activity Limitations  Locomotion Level;Transfers;Bed Mobility;Bend;Sit;Sleep;Squat;Stairs;Stand    Examination-Participation Restrictions  Cleaning;Driving    Rehab Potential  Good    PT Frequency  2x / week    PT Duration  6 weeks    PT Treatment/Interventions  ADLs/Self Care Home Management;Cryotherapy;Electrical Stimulation;Ultrasound;Traction;Moist Heat;Iontophoresis 4mg /ml Dexamethasone;Gait training;Stair training;Functional mobility training;Therapeutic activities;Therapeutic exercise;Balance training;Patient/family education;Neuromuscular re-education;Manual techniques;Passive range of motion;Dry needling;Spinal Manipulations;Taping;Joint Manipulations    PT Next Visit Plan  Continue dry needling prn, extension exercises and nerve glides, continue stretches and strengthening, quadruped exercises    PT Home Exercise Plan  iso Lt hip flx PRN, hooklying adduction, seated HSS, LTR, planks    Consulted and Agree with Plan of Care  Patient       Patient will benefit from skilled therapeutic intervention in order to improve the following deficits and impairments:  Decreased range of motion, Difficulty walking, Increased muscle spasms, Decreased activity tolerance, Pain, Improper body mechanics, Impaired flexibility, Postural dysfunction  Visit Diagnosis: Pain in right hip  Difficulty in  walking, not elsewhere classified  Abnormal posture     Problem List Patient Active Problem List   Diagnosis Date Noted  . 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 Acute sinus infection 08/13/2018  . Allergic rhinitis 08/13/2018  .  Anxiety 01/05/2017  . Acute pharyngitis 05/10/2016  . Routine general medical examination at a health care facility 03/15/2016    Beaulah Dinning, PT, DPT 03/30/19 8:42 AM  [facility] [address] [place], [place], [address] Phone: [phone]   Fax:  [phone]  Name: Ricardo Knight MRN: [id number] Date of Birth: 04/21/1990

## 2019-04-01 ENCOUNTER — Other Ambulatory Visit: Payer: Self-pay

## 2019-04-01 ENCOUNTER — Ambulatory Visit: Payer: BLUE CROSS/BLUE SHIELD | Admitting: Physical Therapy

## 2019-04-01 ENCOUNTER — Encounter: Payer: Self-pay | Admitting: Physical Therapy

## 2019-04-01 DIAGNOSIS — R293 Abnormal posture: Secondary | ICD-10-CM | POA: Diagnosis not present

## 2019-04-01 DIAGNOSIS — R262 Difficulty in walking, not elsewhere classified: Secondary | ICD-10-CM | POA: Diagnosis not present

## 2019-04-01 DIAGNOSIS — M25551 Pain in right hip: Secondary | ICD-10-CM

## 2019-04-01 NOTE — Therapy (Signed)
[place], [place], [address] Phone: [phone]   Fax:  [phone]  Physical Therapy Treatment  Patient Details  Name: Ricardo Knight MRN: [id number] Date of Birth: November 04, 1990 Referring Provider (PT): Gregor Hams, MD   Encounter Date: 04/01/2019  PT End of Session - 04/01/19 0838    Visit Number  8    Number of Visits  13    Date for PT Re-Evaluation  04/10/19    Authorization Type  BC[employer]    PT Start Time  0802    PT Stop Time  0845    PT Time Calculation (min)  43 min    Activity Tolerance  Patient tolerated treatment well    Behavior During Therapy  [facility] for tasks assessed/performed       Past Medical History:  Diagnosis Date  . Anxiety   . Depression   . Exercise-induced asthma   . Heart palpitations   . History of chicken pox     Past Surgical History:  Procedure Laterality Date  . WISDOM TOOTH EXTRACTION      There were no vitals filed for this visit.  Subjective Assessment - 04/01/19 0806    Subjective  Pt. reports last 2 days not too bad but reporting more pain in right buttock, proximal hamstring and calf today 6/10. Overall he rates improvement at 50% since starting therapy. He sees Dr. Georgina Snell tomorrow for follow up.    Currently in Pain?  Yes    Pain Score  6     Pain Location  Hip    Pain Orientation  Right;Lateral;Posterior    Pain Descriptors / Indicators  Dull    Pain Frequency  Intermittent    Aggravating Factors   worse in AM and with sitting    Pain Relieving Factors  stretches, trunk extension         [facility] PT Assessment - 04/01/19 0001      Observation/Other Assessments   Focus on Therapeutic Outcomes (FOTO)   46% limited      Palpation   SI assessment   right anterior innominate rotation                   [facility] Adult PT Treatment/Exercise - 04/01/19 0001      Knee/Hip Exercises: Stretches   Passive Hamstring Stretch  Right;3 reps;30 seconds     Passive Hamstring Stretch Limitations  from 90/90 position    Piriformis Stretch  Right;3 reps;30 seconds    Piriformis Stretch Limitations  supine manual stretch      Knee/Hip Exercises: Seated   Other Seated Knee/Hip Exercises  seated dural nerve glide x 10 reps      Knee/Hip Exercises: Prone   Hamstring Curl  2 sets;10 reps    Hamstring Curl Limitations  R eccentric with 5 lbs.    Hip Extension  AROM;Strengthening;Both;10 reps    Other Prone Exercises  also instructed in hamstring eccentric from tall kneel position      Manual Therapy   Joint Mobilization  R hip LAD grade I-III oscillations    Soft tissue mobilization  roller right posterolateral hip and hamstring, prone release right piriformis with passive hip ER    Muscle Energy Technique  left hip flexion with right hamstring isometric 2 x 5 for 5 sec holds, also reviewed self-MET       Trigger Point Dry Needling - 04/01/19 0001    Consent Given?  Yes    Muscles  Treated Back/Hip  Gluteus medius;Piriformis;Tensor fascia lata    Dry Needling Comments  needled in left sidelying with 30 gauge 75 mm needles    Electrical Stimulation Performed with Dry Needling  Yes    E-stim with Dry Needling Details  TENS 2 pps x 8 min    Gluteus Medius Response  Twitch response elicited    Piriformis Response  Twitch response elicited    Tensor Fascia Lata Response  Twitch response elicited           PT Education - 04/01/19 0837    Education Details  HEP, POC    Person(s) Educated  Patient    Methods  Explanation;Demonstration;Verbal cues    Comprehension  Verbalized understanding          PT Long Term Goals - 04/01/19 0840      PT LONG TERM GOAL #1   Title  HS flexibility to at least 60 deg    Baseline  still 25 deg with pain    Time  6    Period  Weeks    Status  On-going      PT LONG TERM GOAL #2   Title  Pt will be able to sit comfortably on a couch for at least 20 min    Baseline  able later in the day but not in  AM    Time  6    Period  Weeks    Status  On-going      PT LONG TERM GOAL #3   Title  able to ambulate without limping    Baseline  intermittently still limping but improving from baseline    Time  6    Period  Weeks    Status  On-going      PT LONG TERM GOAL #4   Title  able to don socks/shoes without limitation    Baseline  painful in last couple of weeks    Time  6    Period  Weeks    Status  On-going      PT LONG TERM GOAL #5   Title  FOTO to 24% limited    Baseline  46% limited    Time  6    Period  Weeks    Status  On-going            Plan - 04/01/19 6503    Clinical Impression Statement  Mild to moderate improvement overall with therapy to date. Innominate rotation noted again today so corrected with METs. Continue to suspect multifactorial etiology with muscular, SI and potential radicular etiology. Plan continue PT pending MD follow up for further progress with therapy goals.    Personal Factors and Comorbidities  Comorbidity 1;Time since onset of injury/illness/exacerbation    Comorbidities  h/o Rt tibia spiral fracture    Examination-Activity Limitations  Locomotion Level;Transfers;Bed Mobility;Bend;Sit;Sleep;Squat;Stairs;Stand    Examination-Participation Restrictions  Cleaning;Driving    Stability/Clinical Decision Making  Evolving/Moderate complexity    Clinical Decision Making  Moderate    Rehab Potential  Good    PT Frequency  2x / week    PT Duration  6 weeks    PT Treatment/Interventions  ADLs/Self Care Home Management;Cryotherapy;Electrical Stimulation;Ultrasound;Traction;Moist Heat;Iontophoresis 64m/ml Dexamethasone;Gait training;Stair training;Functional mobility training;Therapeutic activities;Therapeutic exercise;Balance training;Patient/family education;Neuromuscular re-education;Manual techniques;Passive range of motion;Dry needling;Spinal Manipulations;Taping;Joint Manipulations    PT Next Visit Plan  Continue dry needling prn, METs, extension  exercises and nerve glides, continue stretches and strengthening, quadruped exercises    PT Home Exercise Plan  iso Lt hip flx PRN, hooklying adduction, seated HSS, LTR, planks    Consulted and Agree with Plan of Care  Patient       Patient will benefit from skilled therapeutic intervention in order to improve the following deficits and impairments:  Decreased range of motion, Difficulty walking, Increased muscle spasms, Decreased activity tolerance, Pain, Improper body mechanics, Impaired flexibility, Postural dysfunction  Visit Diagnosis: Pain in right hip  Difficulty in walking, not elsewhere classified  Abnormal posture     Problem List Patient Active Problem List   Diagnosis Date Noted  . 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 Acute sinus infection 08/13/2018  . Allergic rhinitis 08/13/2018  . Anxiety 01/05/2017  . Acute pharyngitis 05/10/2016  . Routine general medical examination at a health care facility 03/15/2016    Beaulah Dinning, PT, DPT 04/01/19 8:42 AM  [facility] [address] [place], [place], [address] Phone: [phone]   Fax:  [phone]  Name: Ricardo Knight MRN: [id number] Date of Birth: [birth year]

## 2019-04-02 ENCOUNTER — Ambulatory Visit (INDEPENDENT_AMBULATORY_CARE_PROVIDER_SITE_OTHER): Payer: BLUE CROSS/BLUE SHIELD | Admitting: Family Medicine

## 2019-04-02 ENCOUNTER — Ambulatory Visit (INDEPENDENT_AMBULATORY_CARE_PROVIDER_SITE_OTHER): Payer: BLUE CROSS/BLUE SHIELD

## 2019-04-02 ENCOUNTER — Encounter: Payer: Self-pay | Admitting: Family Medicine

## 2019-04-02 VITALS — BP 124/82 | HR 76 | Ht 71.0 in | Wt 185.6 lb

## 2019-04-02 DIAGNOSIS — M7918 Myalgia, other site: Secondary | ICD-10-CM

## 2019-04-02 DIAGNOSIS — M899 Disorder of bone, unspecified: Secondary | ICD-10-CM | POA: Diagnosis not present

## 2019-04-02 DIAGNOSIS — M79604 Pain in right leg: Secondary | ICD-10-CM | POA: Diagnosis not present

## 2019-04-02 DIAGNOSIS — M545 Low back pain: Secondary | ICD-10-CM | POA: Diagnosis not present

## 2019-04-02 DIAGNOSIS — M25551 Pain in right hip: Secondary | ICD-10-CM | POA: Diagnosis not present

## 2019-04-02 NOTE — Progress Notes (Signed)
I, Ricardo Knight, LAT, ATC, am serving as scribe for Ricardo Knight.  Ricardo Knight is a 29 y.o. male who presents to [place] [facility] today for f/u of low back pain and R glute and h/s pain and tightness.  He was last seen by Ricardo Knight on 02/09/19 and was referred to PT and provided w/ a HEP consisting of lumbar ROM, hip aBd and glute strengthening and sciatic nerve glides.  Since his last visit, pt has completed 8 PT sessions and reports that his symptoms have improved but notes that his symptoms are still " a big problem."  He rates his pain w/i a range of 5-7/10.  He wakes up and has pain and has pain if he sits in the wrong position for too long.  He also notes increased pain after he gets out of the shower.  He con't to deny any numbness/tingling into his B LEs.  He has been having dry needling at PT which he feels has been helping.  He states that he has been able to do the elliptical for cardio and has been able to work his way up to 25#.  However, he has stopped lifting weights due to increased pain in his back and R glute.  He also notes increased pain in his low back and R glute w/ sneezing.     ROS:  As above  Exam:  BP 124/82 (BP Location: Left Arm, Patient Position: Sitting, Cuff Size: Normal)   Pulse 76   Ht 5\' 11"  (1.803 m)   Wt 185 lb 9.6 oz (84.2 kg)   SpO2 97%   BMI 25.89 kg/m   MSK:  Right hip: Normal-appearing Normal motion. Mild tender palpation at mid portion of buttocks at gluteus medius and piriformis musculature. Hip abduction strength slightly diminished 4+/5.  External rotation strength intact.  Adduction and internal rotation strength normal.  L-spine: Normal-appearing normal motion nontender lower extremity strength equal and normal bilaterally except were noted above. Reflexes equal and normal throughout bilateral lower extremities.  sensation intact distally.    Lab and Radiology Results No results found for this or any  previous visit (from the past 72 hour(s)). DG Lumbar Spine Complete  Result Date: 04/02/2019 CLINICAL DATA:  Intermittent atraumatic lower back/right hip pain x2 months. EXAM: LUMBAR SPINE - COMPLETE 4+ VIEW COMPARISON:  None. FINDINGS: There is no evidence of lumbar spine fracture. Alignment is normal. Intervertebral disc spaces are maintained. IMPRESSION: Negative. Electronically Signed   By: Ricardo Knight M.D.   On: 04/02/2019 20:23   DG HIP UNILAT WITH PELVIS 2-3 VIEWS RIGHT  Result Date: 04/02/2019 CLINICAL DATA:  Intermittent atraumatic lower back/right hip pain x2 months. EXAM: DG HIP (WITH OR WITHOUT PELVIS) 2-3V RIGHT COMPARISON:  None. FINDINGS: Normal femoral head, neck and intertrochanteric region of the proximal right femur. A 2.2 cm x 1.3 cm sclerotic focus is seen just distal to the intertrochanteric region of the proximal right femur. Normal acetabulum. Normal hip joint. Normal visualized superior and inferior pubic rami and ischial tuberosities. IMPRESSION: 1. 2.2 cm x 1.3 cm sclerotic focus intertrochanteric region of the proximal right femur. This could represent a bone island, however, further evaluation with MRI should be considered for further characterization. 2. No acute osseous abnormality. Electronically Signed   By: Ricardo Knight M.D.   On: 04/02/2019 20:22   I, Ricardo Knight, personally (independently) visualized and performed the interpretation of the images attached in this note.  Assessment and Plan: 29 y.o. male with right lateral hip and buttocks pain.  Pain is somewhat consistent with gluteus medius and piriformis tendinitis.  However he is not improving as I would normally expect to physical therapy.  He has had only moderate improvement despite excellent physical therapy.  X-ray images obtained today were somewhat surprising with sclerotic lesion in proximal right femur.  The location of the sclerotic focus is near the location of his pain and certainly could  be related.  Plan to obtain MRI to further characterize.     PDMP not reviewed this encounter. Orders Placed This Encounter  Procedures  . DG Lumbar Spine Complete    Standing Status:   Future    Number of Occurrences:   1    Standing Expiration Date:   05/30/2020    Order Specific Question:   Reason for Exam (SYMPTOM  OR DIAGNOSIS REQUIRED)    Answer:   eval buttock and right leg pain    Order Specific Question:   Preferred imaging location?    Answer:   [facility]    Order Specific Question:   Radiology Contrast Protocol - do NOT remove file path    Answer:   \\[email or web address].[email or web address]  . DG HIP UNILAT WITH PELVIS 2-3 VIEWS RIGHT    Standing Status:   Future    Number of Occurrences:   1    Standing Expiration Date:   05/30/2020    Order Specific Question:   Reason for Exam (SYMPTOM  OR DIAGNOSIS REQUIRED)    Answer:   eval right buttock pain    Order Specific Question:   Preferred imaging location?    Answer:   Ricardo Knight    Order Specific Question:   Radiology Contrast Protocol - do NOT remove file path    Answer:   \\[email or web address]s.pdf  . MR HIP RIGHT W WO CONTRAST    Standing Status:   Future    Standing Expiration Date:   05/31/2020    Order Specific Question:   If indicated for the ordered procedure, I authorize the administration of contrast media per Radiology protocol    Answer:   Yes    Order Specific Question:   What is the patient's sedation requirement?    Answer:   No Sedation    Order Specific Question:   Does the patient have a pacemaker or implanted devices?    Answer:   No    Order Specific Question:   Radiology Contrast Protocol - do NOT remove file path    Answer:   \\[email or web address]\Radiant\[email or web address]    Order Specific Question:   Preferred imaging location?    Answer:   [facility] (table limit-550lbs)   No orders of the defined types were placed in this  encounter.    Discussed warning signs or symptoms. Please see discharge instructions. Patient expresses understanding.  The above documentation has been reviewed and is accurate and complete Ricardo Knight

## 2019-04-02 NOTE — Patient Instructions (Signed)
.  Thank you for coming in today. We do see a small issue in the bone where you hurt.  Radiology will generate a read later today or tomorrow.  I will get result to you asap.  Likely we will be doing an MRI or CT scan soon in response.

## 2019-04-03 ENCOUNTER — Telehealth: Payer: Self-pay | Admitting: Family Medicine

## 2019-04-03 DIAGNOSIS — M7918 Myalgia, other site: Secondary | ICD-10-CM

## 2019-04-03 DIAGNOSIS — M79604 Pain in right leg: Secondary | ICD-10-CM

## 2019-04-03 DIAGNOSIS — M899 Disorder of bone, unspecified: Secondary | ICD-10-CM

## 2019-04-03 NOTE — Telephone Encounter (Signed)
Patient called stating that the [facility] location for the MRI that was ordered does not have openings available until a month away. He would like to know if the orders could be sent to the [facility] or [place] location?

## 2019-04-03 NOTE — Progress Notes (Signed)
Xray hip has that area of concern (Sclerotic Focus). Radiology agrees with MRI. MRI will be done with contrast.

## 2019-04-03 NOTE — Telephone Encounter (Signed)
We will reorder MRI to be done at [facility].  Could be scheduled as early as Monday or Tuesday January 11 or 12 if authorized.

## 2019-04-03 NOTE — Telephone Encounter (Signed)
Called and informed pt that MRI of R hip has been ordered to [facility] location and pt verbalizes understanding.

## 2019-04-03 NOTE — Progress Notes (Signed)
X-ray lumbar spine is normal-appearing

## 2019-04-08 ENCOUNTER — Ambulatory Visit: Payer: BLUE CROSS/BLUE SHIELD | Admitting: Physical Therapy

## 2019-04-10 ENCOUNTER — Ambulatory Visit: Payer: BLUE CROSS/BLUE SHIELD | Admitting: Physical Therapy

## 2019-04-10 ENCOUNTER — Encounter: Payer: BLUE CROSS/BLUE SHIELD | Admitting: Physical Therapy

## 2019-04-13 ENCOUNTER — Ambulatory Visit (INDEPENDENT_AMBULATORY_CARE_PROVIDER_SITE_OTHER): Payer: BLUE CROSS/BLUE SHIELD

## 2019-04-13 ENCOUNTER — Other Ambulatory Visit: Payer: Self-pay

## 2019-04-13 DIAGNOSIS — M898X5 Other specified disorders of bone, thigh: Secondary | ICD-10-CM | POA: Diagnosis not present

## 2019-04-13 DIAGNOSIS — M899 Disorder of bone, unspecified: Secondary | ICD-10-CM

## 2019-04-13 DIAGNOSIS — M79604 Pain in right leg: Secondary | ICD-10-CM | POA: Diagnosis not present

## 2019-04-13 DIAGNOSIS — M7918 Myalgia, other site: Secondary | ICD-10-CM | POA: Diagnosis not present

## 2019-04-13 MED ORDER — GADOBUTROL 1 MMOL/ML IV SOLN
10.0000 mL | Freq: Once | INTRAVENOUS | Status: AC | PRN
  Administered 2019-04-13: 10 mL via INTRAVENOUS

## 2019-04-14 NOTE — Progress Notes (Signed)
MRI hip shows that thing we saw on x-ray is a bone island and not likely causing your pain.  This is a benign condition and not cancer and should not grow.  Recommend continuing physical therapy.  No significant tendinitis seen in this area as well.

## 2019-04-15 ENCOUNTER — Encounter: Payer: Self-pay | Admitting: Family Medicine

## 2019-04-17 ENCOUNTER — Ambulatory Visit (INDEPENDENT_AMBULATORY_CARE_PROVIDER_SITE_OTHER): Payer: BLUE CROSS/BLUE SHIELD | Admitting: Family Medicine

## 2019-04-17 DIAGNOSIS — M899 Disorder of bone, unspecified: Secondary | ICD-10-CM | POA: Diagnosis not present

## 2019-04-17 DIAGNOSIS — M7918 Myalgia, other site: Secondary | ICD-10-CM | POA: Diagnosis not present

## 2019-04-17 NOTE — Progress Notes (Signed)
Virtual Visit  via Video Note  I connected with      [name] by a video enabled telemedicine application and verified that I am speaking with the correct person using two identifiers.   I discussed the limitations of evaluation and management by telemedicine and the availability of in person appointments. The patient expressed understanding and agreed to proceed.  History of Present Illness: Ricardo Knight is a 29 y.o. male who would like to discuss f/u of low back pain, R glute and R proximal thigh pain.  Pt was last seen in the office on 04/02/19 and had a R hip MRI on 04/13/19.  Overall he has done okay with physical therapy.  He had plateaued a bit and discontinue physical therapy.  The last few weeks after starting physical therapy his pain is creeping back a little bit.  He thinks he was receiving more benefit physical therapy than he was aware of initially.   Observations/Objective: There were no vitals taken for this visit. Wt Readings from Last 5 Encounters:  04/02/19 185 lb 9.6 oz (84.2 kg)  02/09/19 191 lb 6.4 oz (86.8 kg)  01/29/19 185 lb (83.9 kg)  06/02/18 204 lb (92.5 kg)  02/26/18 192 lb (87.1 kg)   Exam: Appearance Normal Speech.    Lab and Radiology Results No results found for this or any previous visit (from the past 72 hour(s)). MR HIP RIGHT W WO CONTRAST  Result Date: 04/13/2019 CLINICAL DATA:  Bone lesion in the right hip. Right hip pain for 2 months. No known injury. EXAM: MRI OF THE RIGHT HIP WITHOUT AND WITH CONTRAST TECHNIQUE: Multiplanar, multisequence MR imaging was performed both before and after administration of intravenous contrast. CONTRAST:  10 mL GADAVIST IV COMPARISON:  Plain films left hip is 04/02/2019. FINDINGS: Bones: There is a well-defined T1 and T2 hypointense lesion in the intertrochanteric right femur measuring 1.4 cm craniocaudal by 1.2 cm AP by 1.3 cm transverse. The lesion does not enhance. There is no surrounding edema. No  periosteal reaction, soft tissue mass or associated fracture is present. Bone marrow signal is otherwise normal. Articular cartilage and labrum Articular cartilage:  Normal. Labrum:  Intact. Joint or bursal effusion Joint effusion:  None. Bursae: Normal. Muscles and tendons Muscles and tendons:  Normal. Other findings Miscellaneous:   None.  Imaged intrapelvic contents are normal. IMPRESSION: Small lesion in the right intertrochanteric is benign and has an appearance most consistent with a bone island. No follow-up imaging is recommended. Electronically Signed   By: Drusilla Kanner M.D.   On: 04/13/2019 13:31   I, [name], personally (independently) visualized and performed the interpretation of the images attached in this note.   Assessment and Plan: 29 y.o. male with right buttocks and lateral hip pain.  Thankfully abnormality seen on x-ray does not seem to be a factor to explain his pain today.  No evidence of malignancy or even stress fracture or stress reaction around the presumed bone island.  Unfortunately MRI did not show severe or significant hip abductor or piriformis tendinitis.  However I think the main explanation for his pain still is dysfunction of these muscles and tendons.  Plan to continue physical therapy.  If not improving patient will notify me.  At that time we may pursue further evaluation including lumbar radiculopathy as an expiration of his pain.  Follow Up Instructions:    I discussed the assessment and treatment plan with the patient. The patient was provided an opportunity  to ask questions and all were answered. The patient agreed with the plan and demonstrated an understanding of the instructions.   The patient was advised to call back or seek an in-person evaluation if the symptoms worsen or if the condition fails to improve as anticipated.  Time: 15 minutes of intraservice time, with >22 minutes of total time during today's visit.       Historical  information moved to improve visibility of documentation.  Past Medical History:  Diagnosis Date  . Anxiety   . Depression   . Exercise-induced asthma   . Heart palpitations   . History of chicken pox    Past Surgical History:  Procedure Laterality Date  . WISDOM TOOTH EXTRACTION     Social History   Tobacco Use  . Smoking status: Never Smoker  . Smokeless tobacco: Never Used  Substance Use Topics  . Alcohol use: Yes    Alcohol/week: 7.0 - 10.0 standard drinks    Types: 7 - 10 Cans of beer per week    Comment: socially   family history includes Anxiety disorder in his maternal grandmother and mother; Cancer in his paternal grandfather; Depression in his mother; Healthy in his paternal grandmother; Heart attack in his maternal grandmother; Heart disease in his maternal grandfather; Hyperlipidemia in his father; Hypertension in his father and maternal grandfather.  Medications: Current Outpatient Medications  Medication Sig Dispense Refill  . loratadine (CLARITIN) 10 MG tablet Take 10 mg by mouth daily.    . meloxicam (MOBIC) 15 MG tablet Take 1 tablet (15 mg total) by mouth daily as needed for pain. 90 tablet 1   No current facility-administered medications for this visit.   No Known Allergies                                      The above documentation has been reviewed and is accurate and complete Lynne Leader

## 2019-04-29 ENCOUNTER — Ambulatory Visit: Payer: BLUE CROSS/BLUE SHIELD | Attending: Family Medicine | Admitting: Physical Therapy

## 2019-04-29 ENCOUNTER — Other Ambulatory Visit: Payer: Self-pay

## 2019-04-29 ENCOUNTER — Encounter: Payer: Self-pay | Admitting: Physical Therapy

## 2019-04-29 DIAGNOSIS — R293 Abnormal posture: Secondary | ICD-10-CM | POA: Diagnosis not present

## 2019-04-29 DIAGNOSIS — R262 Difficulty in walking, not elsewhere classified: Secondary | ICD-10-CM | POA: Diagnosis not present

## 2019-04-29 DIAGNOSIS — M25551 Pain in right hip: Secondary | ICD-10-CM

## 2019-04-29 NOTE — Therapy (Signed)
[facility] [address] [place], [place], [address] Phone: [phone]   Fax:  [phone]  Physical Therapy Treatment/Recertification  Patient Details  Name: Ricardo Knight MRN: [id number] Date of Birth: [birth year] Referring Provider (PT): Rodolph Bong, MD   Encounter Date: 04/29/2019  PT End of Session - 04/29/19 0807    Visit Number  9    Number of Visits  21    Date for PT Re-Evaluation  06/12/19    Authorization Type  BCBS    PT Start Time  0803    PT Stop Time  0844    PT Time Calculation (min)  41 min    Activity Tolerance  Patient tolerated treatment well    Behavior During Therapy  [facility] for tasks assessed/performed       Past Medical History:  Diagnosis Date  . Anxiety   . Depression   . Exercise-induced asthma   . Heart palpitations   . History of chicken pox     Past Surgical History:  Procedure Laterality Date  . WISDOM TOOTH EXTRACTION      There were no vitals filed for this visit.  Subjective Assessment - 04/29/19 0804    Subjective  I would say there is improvement. No really bad spasms, flexibility is a little better and I can stand a little longer.    How long can you stand comfortably?  5-10 min and I can feel it, was able to be on my feet 3 hrs straight on Friday.    Currently in Pain?  Yes    Pain Score  4     Pain Location  Hip    Pain Orientation  Right    Pain Descriptors / Indicators  Spasm;Sharp    Aggravating Factors   being upright too long, supine ITB stretch    Pain Relieving Factors  regimenting stretches         [facility] PT Assessment - 04/29/19 0001      ROM / Strength   AROM / PROM / Strength  Strength      AROM   AROM Assessment Site  Lumbar    Lumbar Flexion  mid-shin, LBP pain and bil post knee pain    Lumbar Extension  WFL, stretch    Lumbar - Right Side Bend  knee joint line, pain    Lumbar - Left Side Bend  knee joint line, no pain    Lumbar - Right Rotation   WFL, Rt pain    Lumbar - Left Rotation  WFL, Rt pain      Strength   Strength Assessment Site  Hip    Right/Left Hip  Right;Left    Right Hip Flexion  5/5    Right Hip Extension  5/5    Right Hip ABduction  4/5    Left Hip Flexion  5/5    Left Hip Extension  5/5    Left Hip ABduction  5/5      Flexibility   Hamstrings  Lt 45 Rt 30      Special Tests   Other special tests  good pelvic mobility in seated                   [facility] Adult PT Treatment/Exercise - 04/29/19 0001      Knee/Hip Exercises: Stretches   Other Knee/Hip Stretches  physioball rollout- discussed use of desk at home      Knee/Hip Exercises: Standing   Other Standing Knee  Exercises  standing posture- weight to heels      Modalities   Modalities  Iontophoresis      Iontophoresis   Type of Iontophoresis  Dexamethasone    Location  Rt gr. trochanter    Dose  1cc    Time  6hr wear      Manual Therapy   Soft tissue mobilization  Rt glut med/min             PT Education - 04/29/19 0930    Education Details  goals FOTO, anatomy of condition, POC, HEP, exercise form/raitonale    Person(s) Educated  Patient    Methods  Explanation    Comprehension  Verbalized understanding;Need further instruction          PT Long Term Goals - 04/29/19 0820      PT LONG TERM GOAL #1   Title  HS flexibility to at least 60 deg    Status  On-going      PT LONG TERM GOAL #2   Title  Pt will be able to sit comfortably on a couch for at least 20 min    Baseline  I can re-adjust better    Status  Achieved      PT LONG TERM GOAL #3   Title  able to ambulate without limping    Status  Achieved      PT LONG TERM GOAL #4   Title  able to don socks/shoes without limitation    Baseline  still a little tight    Status  On-going      PT LONG TERM GOAL #5   Title  FOTO to 24% limited    Baseline  44% limited    Status  On-going      Additional Long Term Goals   Additional Long Term Goals  Yes       PT LONG TERM GOAL #6   Title  able to return to walking for exercising    Baseline  has been avoiding    Time  6    Period  Weeks    Status  New    Target Date  06/12/19      PT LONG TERM GOAL #7   Title  return to gym lifting weights    Baseline  avoiding due to pain    Time  6    Period  Weeks    Status  New    Target Date  06/12/19            Plan - 04/29/19 0845    Clinical Impression Statement  Gross improvements in flexibility with continued discomfort in Rt lateral hip musculature. good pelvic mobility in seated with knee bent but difficulty extending knee. practiced proper standing posture which was uncomfortable as expected with weakness in R hip abductors. Placed ionto patch per request and will determine if this made a difference at next session.    PT Treatment/Interventions  ADLs/Self Care Home Management;Cryotherapy;Electrical Stimulation;Ultrasound;Traction;Moist Heat;Iontophoresis 4mg /ml Dexamethasone;Gait training;Stair training;Functional mobility training;Therapeutic activities;Therapeutic exercise;Balance training;Patient/family education;Neuromuscular re-education;Manual techniques;Passive range of motion;Dry needling;Spinal Manipulations;Taping;Joint Manipulations    PT Next Visit Plan  Continue dry needling prn, METs, extension exercises and nerve glides, continue stretches and strengthening, quadruped exercises    PT Home Exercise Plan  iso Lt hip flx PRN, hooklying adduction, seated HSS, LTR, planks    Consulted and Agree with Plan of Care  Patient       Patient will benefit from skilled therapeutic intervention  in order to improve the following deficits and impairments:  Decreased range of motion, Difficulty walking, Increased muscle spasms, Decreased activity tolerance, Pain, Improper body mechanics, Impaired flexibility, Postural dysfunction  Visit Diagnosis: Pain in right hip - Plan: PT plan of care cert/re-cert  Difficulty in walking, not elsewhere  classified - Plan: PT plan of care cert/re-cert  Abnormal posture - Plan: PT plan of care cert/re-cert     Problem List Patient Active Problem List   Diagnosis Date Noted  . 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 Acute sinus infection 08/13/2018  . Allergic rhinitis 08/13/2018  . Anxiety 01/05/2017  . Acute pharyngitis 05/10/2016  . Routine general medical examination at a health care facility 03/15/2016   Oluwatimileyin Vivier C. Jenene Kauffmann PT, DPT 04/29/19 9:35 AM   [facility] [address] [place], [place], [address] Phone: [phone]   Fax:  [phone]  Name: Ricardo Knight MRN: [id number] Date of Birth: 1990/08/30

## 2019-04-30 ENCOUNTER — Ambulatory Visit: Payer: BLUE CROSS/BLUE SHIELD | Attending: Internal Medicine

## 2019-04-30 DIAGNOSIS — Z20822 Contact with and (suspected) exposure to covid-19: Secondary | ICD-10-CM

## 2019-05-01 ENCOUNTER — Ambulatory Visit: Payer: BLUE CROSS/BLUE SHIELD | Admitting: Physical Therapy

## 2019-05-01 LAB — NOVEL CORONAVIRUS, NAA: SARS-CoV-2, NAA: NOT DETECTED

## 2019-05-04 ENCOUNTER — Ambulatory Visit: Payer: BLUE CROSS/BLUE SHIELD | Admitting: Physical Therapy

## 2019-05-06 ENCOUNTER — Ambulatory Visit: Payer: BLUE CROSS/BLUE SHIELD | Admitting: Physical Therapy

## 2019-05-06 ENCOUNTER — Other Ambulatory Visit: Payer: Self-pay

## 2019-05-06 DIAGNOSIS — R293 Abnormal posture: Secondary | ICD-10-CM | POA: Diagnosis not present

## 2019-05-06 DIAGNOSIS — R262 Difficulty in walking, not elsewhere classified: Secondary | ICD-10-CM | POA: Diagnosis not present

## 2019-05-06 DIAGNOSIS — M25551 Pain in right hip: Secondary | ICD-10-CM | POA: Diagnosis not present

## 2019-05-06 NOTE — Therapy (Signed)
[place], [place], [address] Phone: [phone]   Fax:  [phone]  Physical Therapy Treatment  Patient Details  Name: Ricardo Knight MRN: [id number] Date of Birth: 1990/12/25 Referring Provider (PT): Gregor Hams, MD   Encounter Date: 05/06/2019  PT End of Session - 05/06/19 0806    Visit Number  10    Number of Visits  21    Date for PT Re-Evaluation  06/12/19    Authorization Type  BCBS    PT Start Time  0802    PT Stop Time  0844    PT Time Calculation (min)  42 min    Activity Tolerance  Patient tolerated treatment well    Behavior During Therapy  [facility] for tasks assessed/performed       Past Medical History:  Diagnosis Date  . Anxiety   . Depression   . Exercise-induced asthma   . Heart palpitations   . History of chicken pox     Past Surgical History:  Procedure Laterality Date  . WISDOM TOOTH EXTRACTION      There were no vitals filed for this visit.  Subjective Assessment - 05/06/19 0803    Subjective  did not feel much of a difference with ionto patch right away. rode an upright bike for the first time in a while yesterday and it went fine. Walking seems better than standing. limited to about 15 min at standing desk         [facility] PT Assessment - 05/06/19 0001      Flexibility   Hamstrings  Rt to visually 40 deg                   [facility] Adult PT Treatment/Exercise - 05/06/19 0001      Knee/Hip Exercises: Stretches   Gastroc Stretch Limitations  30s slant board    Soleus Stretch Limitations  30s slant board      Knee/Hip Exercises: Aerobic   Stationary Bike  5 min L3      Knee/Hip Exercises: Standing   Rocker Board Limitations  A/P & lateral static & dynamic    Other Standing Knee Exercises  standing posture at desk- on rocker board for weight placement/control      Iontophoresis   Type of Iontophoresis  Dexamethasone    Location  Rt gr. trochanter     Dose  1cc    Time  6hr wear      Manual Therapy   Manual therapy comments  HSS & ITB with inf & post pressure through femur    Joint Mobilization  Rt FA inf Gr 4 5x30s    Soft tissue mobilization  IASTM Rt glut med/min/piriformis, hamstrings             PT Education - 05/06/19 1019    Education Details  work set up [employer]) Educated  Patient    Methods  Explanation    Comprehension  Verbalized understanding;Need further instruction          PT Long Term Goals - 04/29/19 0820      PT LONG TERM GOAL #1   Title  HS flexibility to at least 60 deg    Status  On-going      PT LONG TERM GOAL #2   Title  Pt will be able to sit comfortably on a couch for at least 20 min    Baseline  I can re-adjust better  Status  Achieved      PT LONG TERM GOAL #3   Title  able to ambulate without limping    Status  Achieved      PT LONG TERM GOAL #4   Title  able to don socks/shoes without limitation    Baseline  still a little tight    Status  On-going      PT LONG TERM GOAL #5   Title  FOTO to 24% limited    Baseline  44% limited    Status  On-going      Additional Long Term Goals   Additional Long Term Goals  Yes      PT LONG TERM GOAL #6   Title  able to return to walking for exercising    Baseline  has been avoiding    Time  6    Period  Weeks    Status  New    Target Date  06/12/19      PT LONG TERM GOAL #7   Title  return to gym lifting weights    Baseline  avoiding due to pain    Time  6    Period  Weeks    Status  New    Target Date  06/12/19            Plan - 05/06/19 0806    Clinical Impression Statement  Lt hip hike noted in lateral dynamic balance with discomfort in Rt hip. tight post hip capsule and began mobilizations to improve hip hinge. pt reported feeling like he had more blood flow into the leg following. Will try to adjust his work set up at home. applied ionto patch one more time to see if it offered any assist but if not  we will not continue with it.    PT Treatment/Interventions  ADLs/Self Care Home Management;Cryotherapy;Electrical Stimulation;Ultrasound;Traction;Moist Heat;Iontophoresis 4mg /ml Dexamethasone;Gait training;Stair training;Functional mobility training;Therapeutic activities;Therapeutic exercise;Balance training;Patient/family education;Neuromuscular re-education;Manual techniques;Passive range of motion;Dry needling;Spinal Manipulations;Taping;Joint Manipulations    PT Next Visit Plan  hip mobs, outcome of ionto? qped exercises, adjust desk?    PT Home Exercise Plan  iso Lt hip flx PRN, hooklying adduction, seated HSS, LTR, planks    Consulted and Agree with Plan of Care  Patient       Patient will benefit from skilled therapeutic intervention in order to improve the following deficits and impairments:  Decreased range of motion, Difficulty walking, Increased muscle spasms, Decreased activity tolerance, Pain, Improper body mechanics, Impaired flexibility, Postural dysfunction  Visit Diagnosis: Pain in right hip  Difficulty in walking, not elsewhere classified  Abnormal posture     Problem List Patient Active Problem List   Diagnosis Date Noted  . 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 Acute sinus infection 08/13/2018  . Allergic rhinitis 08/13/2018  . Anxiety 01/05/2017  . Acute pharyngitis 05/10/2016  . Routine general medical examination at a health care facility 03/15/2016   Jahaad Penado C. Mikiah Demond PT, DPT 05/06/19 10:20 AM   [facility] [address] [place], [place], [place] Phone: [phone]   Fax:  [phone]  Name: Shaurya Rawdon MRN: [name] Date of Birth: [birth year]

## 2019-05-11 ENCOUNTER — Ambulatory Visit: Payer: BLUE CROSS/BLUE SHIELD | Admitting: Physical Therapy

## 2019-05-14 ENCOUNTER — Ambulatory Visit: Payer: BLUE CROSS/BLUE SHIELD | Admitting: Physical Therapy

## 2019-05-18 ENCOUNTER — Ambulatory Visit: Payer: BLUE CROSS/BLUE SHIELD | Admitting: Physical Therapy

## 2019-05-18 ENCOUNTER — Encounter: Payer: Self-pay | Admitting: Physical Therapy

## 2019-05-18 ENCOUNTER — Other Ambulatory Visit: Payer: Self-pay

## 2019-05-18 DIAGNOSIS — R293 Abnormal posture: Secondary | ICD-10-CM | POA: Diagnosis not present

## 2019-05-18 DIAGNOSIS — R262 Difficulty in walking, not elsewhere classified: Secondary | ICD-10-CM | POA: Diagnosis not present

## 2019-05-18 DIAGNOSIS — M25551 Pain in right hip: Secondary | ICD-10-CM

## 2019-05-18 NOTE — Therapy (Signed)
[facility] [address] [place], [place], [address] Phone: [phone]   Fax:  [phone]  Physical Therapy Treatment  Patient Details  Name: Ricardo Knight MRN: [id number] Date of Birth: 1990-08-03 Referring Provider (PT): Rodolph Bong, MD   Encounter Date: 05/18/2019  PT End of Session - 05/18/19 0808    Visit Number  11    Number of Visits  21    Date for PT Re-Evaluation  06/12/19    Authorization Type  BCBS    PT Start Time  0803    PT Stop Time  0844    PT Time Calculation (min)  41 min    Activity Tolerance  Patient tolerated treatment well    Behavior During Therapy  [facility] for tasks assessed/performed       Past Medical History:  Diagnosis Date  . Anxiety   . Depression   . Exercise-induced asthma   . Heart palpitations   . History of chicken pox     Past Surgical History:  Procedure Laterality Date  . WISDOM TOOTH EXTRACTION      There were no vitals filed for this visit.  Subjective Assessment - 05/18/19 0803    Subjective  Pt. reports felt like last ionto patch was helpful for hip pain. Overall feels like moving in the right direction but still with intermittent symptoms. Stretching continues to help.    Currently in Pain?  Yes    Pain Score  --   3-4/10   Pain Location  Hip    Pain Orientation  Right    Pain Descriptors / Indicators  Sharp    Pain Type  Chronic pain    Pain Frequency  Intermittent    Aggravating Factors   variable, being upright too long    Pain Relieving Factors  stretches                       [facility] Adult PT Treatment/Exercise - 05/18/19 0001      Knee/Hip Exercises: Aerobic   Stationary Bike  L3 x 5 min      Knee/Hip Exercises: Standing   Lateral Step Up  Right;1 set;15 reps;Step Height: 8"    Forward Step Up Limitations  front step up to 8 in. step with opp hip hike x 15 reps   R side   Rocker Board Limitations  A/P & lateral static & dynamic   x 2  min   Other Standing Knee Exercises  hip hike with right foot on 4 in. step x 15 reps      Knee/Hip Exercises: Prone   Other Prone Exercises  quadruped alt. LE raises x 15 reps      Iontophoresis   Type of Iontophoresis  Dexamethasone    Location  Rt gr. trochanter    Dose  1cc    Time  6hr wear      Manual Therapy   Joint Mobilization  prone grade III-IV PA in figure 4 position    Soft tissue mobilization  IASTM right glut and hamstring       Trigger Point Dry Needling - 05/18/19 0001    Consent Given?  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  Patient    Methods  Explanation    Comprehension  Verbalized understanding          PT Long Term Goals - 04/29/19 0820      PT LONG TERM GOAL #1   Title  HS flexibility to at least 60 deg    Status  On-going      PT LONG TERM GOAL #2   Title  Pt will be able to sit comfortably on a couch for at least 20 min    Baseline  I can re-adjust better    Status  Achieved      PT LONG TERM GOAL #3   Title  able to ambulate without limping    Status  Achieved      PT LONG TERM GOAL #4   Title  able to don socks/shoes without limitation    Baseline  still a little tight    Status  On-going      PT LONG TERM GOAL #5   Title  FOTO to 24% limited    Baseline  44% limited    Status  On-going      Additional Long Term Goals   Additional Long Term Goals  Yes      PT LONG TERM GOAL #6   Title  able to return to walking for exercising    Baseline  has been avoiding    Time  6    Period  Weeks    Status  New    Target Date  06/12/19      PT LONG TERM GOAL #7    Title  return to gym lifting weights    Baseline  avoiding due to pain    Time  6    Period  Weeks    Status  New    Target Date  06/12/19            Plan - 05/18/19 0837    Clinical Impression Statement  Continued work on proximal hip stability for exercises and continued manual + ionto given benefit noted. Resumed dry needling per discussing tx. options with pt. and previous benefit noted. Making gradual progress with decreased hip pain and tightness and improving positional tolerance.    Personal Factors and Comorbidities  Comorbidity 1;Time since onset of injury/illness/exacerbation    Comorbidities  h/o Rt tibia spiral fracture    Examination-Activity Limitations  Locomotion Level;Transfers;Bed Mobility;Bend;Sit;Sleep;Squat;Stairs;Stand    Examination-Participation Restrictions  Cleaning;Driving    Stability/Clinical Decision Making  Evolving/Moderate complexity    Clinical Decision Making  Moderate    Rehab Potential  Good    PT Frequency  2x / week    PT Duration  6 weeks    PT Treatment/Interventions  ADLs/Self Care Home Management;Cryotherapy;Electrical Stimulation;Ultrasound;Traction;Moist Heat;Iontophoresis 4mg /ml Dexamethasone;Gait training;Stair training;Functional mobility training;Therapeutic activities;Therapeutic exercise;Balance training;Patient/family education;Neuromuscular re-education;Manual techniques;Passive range of motion;Dry needling;Spinal Manipulations;Taping;Joint Manipulations    PT Next Visit Plan  hip mobs, ionto, continue hip strengthening, stretches, further dry needling prn, ergonomocs for work    PT Home Exercise Plan  iso Lt hip flx PRN, hooklying adduction, seated HSS, LTR, planks    Consulted and Agree with Plan of Care  Patient       Patient will benefit from skilled therapeutic intervention in order to improve the  following deficits and impairments:  Decreased range of motion, Difficulty walking, Increased muscle spasms, Decreased activity  tolerance, Pain, Improper body mechanics, Impaired flexibility, Postural dysfunction  Visit Diagnosis: Pain in right hip  Difficulty in walking, not elsewhere classified  Abnormal posture     Problem List Patient Active Problem List   Diagnosis Date Noted  . 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 Acute sinus infection 08/13/2018  . Allergic rhinitis 08/13/2018  . Anxiety 01/05/2017  . Acute pharyngitis 05/10/2016  . Routine general medical examination at a health care facility 03/15/2016    Beaulah Dinning, PT, DPT 05/18/19 8:40 AM  [facility] [address] [place], [place], [address] Phone: [phone]   Fax:  [phone]  Name: Erman Thum MRN: [id number] Date of Birth: Nov 08, 1990

## 2019-05-20 ENCOUNTER — Encounter: Payer: Self-pay | Admitting: Physical Therapy

## 2019-05-20 ENCOUNTER — Ambulatory Visit: Payer: BLUE CROSS/BLUE SHIELD | Admitting: Physical Therapy

## 2019-05-20 ENCOUNTER — Other Ambulatory Visit: Payer: Self-pay

## 2019-05-20 DIAGNOSIS — M25551 Pain in right hip: Secondary | ICD-10-CM | POA: Diagnosis not present

## 2019-05-20 DIAGNOSIS — R293 Abnormal posture: Secondary | ICD-10-CM

## 2019-05-20 DIAGNOSIS — R262 Difficulty in walking, not elsewhere classified: Secondary | ICD-10-CM

## 2019-05-20 NOTE — Therapy (Signed)
[facility] [address] [place], [place], [address] Phone: [phone]   Fax:  [phone]  Physical Therapy Treatment  Patient Details  Name: Ricardo Knight MRN: [id number] Date of Birth: 12/10/1990 Referring Provider (PT): Rodolph Bong, MD   Encounter Date: 05/20/2019  PT End of Session - 05/20/19 0817    Visit Number  12    Number of Visits  21    Date for PT Re-Evaluation  06/12/19    Authorization Type  BCBS    PT Start Time  0804    PT Stop Time  0842    PT Time Calculation (min)  38 min    Activity Tolerance  Patient tolerated treatment well    Behavior During Therapy  [facility] for tasks assessed/performed       Past Medical History:  Diagnosis Date  . Anxiety   . Depression   . Exercise-induced asthma   . Heart palpitations   . History of chicken pox     Past Surgical History:  Procedure Laterality Date  . WISDOM TOOTH EXTRACTION      There were no vitals filed for this visit.  Subjective Assessment - 05/20/19 0806    Subjective  Did well after last tx. and reports relief from ionto in particular. No new complaints/concerns otherwise this AM.                       [facility] Adult PT Treatment/Exercise - 05/20/19 0001      Knee/Hip Exercises: Stretches   Piriformis Stretch  Right;3 reps;30 seconds    Other Knee/Hip Stretches  figure 4 stretch 30 sec x 3      Knee/Hip Exercises: Aerobic   Elliptical  R3 L3 x 5 min      Knee/Hip Exercises: Standing   Lateral Step Up  Right;2 sets;10 reps;Step Height: 8"    Lateral Step Up Limitations  6 lb. DB ea. UE    Forward Step Up Limitations  front step up to 8 in. step with opp hip hike 2x10   R side, 6 lb. DB ea. UE   Functional Squat Limitations  TRX squat with blue Theraband proximal to knees 2x10    Rocker Board Limitations  A/P & lateral static & dynamic   x 2 min   Other Standing Knee Exercises  Monster walk with blue band proximal to knees  20 feet x 2    Other Standing Knee Exercises  TRX curtsy lunge x 10 reps right side      Iontophoresis   Type of Iontophoresis  Dexamethasone    Location  Rt gr. trochanter    Dose  1cc    Time  6hr wear      Manual Therapy   Joint Mobilization  prone grade III-IV PA in figure 4 position    Soft tissue mobilization  IASTM right glut and hamstring             PT Education - 05/20/19 0816    Education Details  POC    Person(s) Educated  Patient    Methods  Explanation    Comprehension  Verbalized understanding          PT Long Term Goals - 04/29/19 0820      PT LONG TERM GOAL #1   Title  HS flexibility to at least 60 deg    Status  On-going      PT LONG TERM GOAL #2  Title  Pt will be able to sit comfortably on a couch for at least 20 min    Baseline  I can re-adjust better    Status  Achieved      PT LONG TERM GOAL #3   Title  able to ambulate without limping    Status  Achieved      PT LONG TERM GOAL #4   Title  able to don socks/shoes without limitation    Baseline  still a little tight    Status  On-going      PT LONG TERM GOAL #5   Title  FOTO to 24% limited    Baseline  44% limited    Status  On-going      Additional Long Term Goals   Additional Long Term Goals  Yes      PT LONG TERM GOAL #6   Title  able to return to walking for exercising    Baseline  has been avoiding    Time  6    Period  Weeks    Status  New    Target Date  06/12/19      PT LONG TERM GOAL #7   Title  return to gym lifting weights    Baseline  avoiding due to pain    Time  6    Period  Weeks    Status  New    Target Date  06/12/19            Plan - 05/20/19 0817    Clinical Impression Statement  Good response as previously to ionto addition to right greater trochanteric region. More exercise progression focus today for lateral hip stability and continued manual, ionto due to previous benefit noted. Some soreness at times with unilateral closed-chain loading  right hip (such as curtsy lunge) but overall session well-tolerated and progressing re: therapy goals.    Personal Factors and Comorbidities  Comorbidity 1;Time since onset of injury/illness/exacerbation    Comorbidities  h/o Rt tibia spiral fracture    Examination-Activity Limitations  Locomotion Level;Transfers;Bed Mobility;Bend;Sit;Sleep;Squat;Stairs;Stand    Examination-Participation Restrictions  Cleaning;Driving    Stability/Clinical Decision Making  Evolving/Moderate complexity    Clinical Decision Making  Moderate    Rehab Potential  Good    PT Frequency  2x / week    PT Duration  6 weeks    PT Treatment/Interventions  ADLs/Self Care Home Management;Cryotherapy;Electrical Stimulation;Ultrasound;Traction;Moist Heat;Iontophoresis 4mg /ml Dexamethasone;Gait training;Stair training;Functional mobility training;Therapeutic activities;Therapeutic exercise;Balance training;Patient/family education;Neuromuscular re-education;Manual techniques;Passive range of motion;Dry needling;Spinal Manipulations;Taping;Joint Manipulations    PT Next Visit Plan  hip mobs, ionto, continue hip strengthening, stretches, further dry needling prn, ergonomocs for work    PT Home Exercise Plan  iso Lt hip flx PRN, hooklying adduction, seated HSS, LTR, planks    Consulted and Agree with Plan of Care  Patient       Patient will benefit from skilled therapeutic intervention in order to improve the following deficits and impairments:  Decreased range of motion, Difficulty walking, Increased muscle spasms, Decreased activity tolerance, Pain, Improper body mechanics, Impaired flexibility, Postural dysfunction  Visit Diagnosis: Pain in right hip  Difficulty in walking, not elsewhere classified  Abnormal posture     Problem List Patient Active Problem List   Diagnosis Date Noted  . 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 Acute sinus infection 08/13/2018  . Allergic rhinitis 08/13/2018  . Anxiety 01/05/2017  .  Acute pharyngitis 05/10/2016  . Routine general medical examination at a health care facility  03/15/2016    Lazarus Gowda, PT, DPT 05/20/19 8:45 AM  [facility] [address] [place], [place], [address] Phone: [phone]   Fax:  [phone]  Name: Ricardo Knight MRN: [id number] Date of Birth: [birth year]

## 2019-05-25 ENCOUNTER — Ambulatory Visit: Payer: BLUE CROSS/BLUE SHIELD | Admitting: Physical Therapy

## 2019-05-27 ENCOUNTER — Ambulatory Visit: Payer: BLUE CROSS/BLUE SHIELD | Attending: Family Medicine | Admitting: Physical Therapy

## 2019-05-27 ENCOUNTER — Encounter: Payer: Self-pay | Admitting: Physical Therapy

## 2019-05-27 ENCOUNTER — Other Ambulatory Visit: Payer: Self-pay

## 2019-05-27 DIAGNOSIS — R262 Difficulty in walking, not elsewhere classified: Secondary | ICD-10-CM | POA: Diagnosis not present

## 2019-05-27 DIAGNOSIS — M25551 Pain in right hip: Secondary | ICD-10-CM | POA: Diagnosis not present

## 2019-05-27 DIAGNOSIS — R293 Abnormal posture: Secondary | ICD-10-CM | POA: Diagnosis not present

## 2019-05-27 NOTE — Therapy (Signed)
[place], [place], [address] Phone: [phone]   Fax:  [phone]  Physical Therapy Treatment  Patient Details  Name: Ricardo Knight MRN: [id number] Date of Birth: 1991/02/03 Referring Provider (PT): Gregor Hams, MD   Encounter Date: 05/27/2019  PT End of Session - 05/27/19 0841    Visit Number  13    Number of Visits  21    Date for PT Re-Evaluation  06/12/19    Authorization Type  BCBS    PT Start Time  0801    PT Stop Time  0845    PT Time Calculation (min)  44 min    Activity Tolerance  Patient tolerated treatment well    Behavior During Therapy  [facility] for tasks assessed/performed       Past Medical History:  Diagnosis Date  . Anxiety   . Depression   . Exercise-induced asthma   . Heart palpitations   . History of chicken pox     Past Surgical History:  Procedure Laterality Date  . WISDOM TOOTH EXTRACTION      There were no vitals filed for this visit.  Subjective Assessment - 05/27/19 0803    Subjective  Reports "rough day" yesterday with pain in right posterolateral hip, Notes relief with quadruped leg extension as well as sidelying abduction SLR. Moderate pain this AM.    Currently in Pain?  Yes    Pain Score  5     Pain Location  Hip    Pain Orientation  Right    Pain Descriptors / Indicators  Sharp    Pain Type  Chronic pain    Pain Frequency  Intermittent    Aggravating Factors   varies, prolonged standing    Pain Relieving Factors  stretches, exercises for quadruped hip extension and abd SLR                       [facility] Adult PT Treatment/Exercise - 05/27/19 0001      Knee/Hip Exercises: Aerobic   Elliptical  R2 L5 x 5 min      Knee/Hip Exercises: Standing   Hip Abduction  AROM;Stengthening;Right;2 sets;10 reps    Abduction Limitations  Green band    Lateral Step Up  Right;Left;2 sets;10 reps;Step Height: 8"    Lateral Step Up Limitations  6 lb. DB  ea. UE    Forward Step Up Limitations  front step up to 8 in. step with opp hip hike 2x10   both sides, 6 lb. DB ea. UE   Functional Squat Limitations  TRX squat with green Theraband proximal to knees 2x10      Iontophoresis   Type of Iontophoresis  Dexamethasone    Location  Rt gr. trochanter    Dose  1cc    Time  6hr wear      Manual Therapy   Joint Mobilization  prone grade III-IV PA in figure 4 position    Soft tissue mobilization  IASTM right glut and hamstring       Trigger Point Dry Needling - 05/27/19 0001    Consent Given?  Yes    Muscles Treated Back/Hip  Gluteus medius;Gluteus maximus;Piriformis;Quadratus lumborum   also included right TFL   Dry Needling Comments  needling in left sidelying with 32 gauge 50 mm needles for all muscles except 30 gauge 75 mm needle for QL    Electrical Stimulation Performed with Dry Needling  Yes  E-stim with Dry Needling Details  TENS 2 pps x 10 minutes    Gluteus Medius Response  Twitch response elicited    Piriformis Response  Twitch response elicited           PT Education - 05/27/19 0842    Education Details  POC, exercises    Person(s) Educated  Patient    Methods  Explanation;Demonstration;Verbal cues    Comprehension  Verbalized understanding;Returned demonstration          PT Long Term Goals - 04/29/19 0820      PT LONG TERM GOAL #1   Title  HS flexibility to at least 60 deg    Status  On-going      PT LONG TERM GOAL #2   Title  Pt will be able to sit comfortably on a couch for at least 20 min    Baseline  I can re-adjust better    Status  Achieved      PT LONG TERM GOAL #3   Title  able to ambulate without limping    Status  Achieved      PT LONG TERM GOAL #4   Title  able to don socks/shoes without limitation    Baseline  still a little tight    Status  On-going      PT LONG TERM GOAL #5   Title  FOTO to 24% limited    Baseline  44% limited    Status  On-going      Additional Long Term Goals    Additional Long Term Goals  Yes      PT LONG TERM GOAL #6   Title  able to return to walking for exercising    Baseline  has been avoiding    Time  6    Period  Weeks    Status  New    Target Date  06/12/19      PT LONG TERM GOAL #7   Title  return to gym lifting weights    Baseline  avoiding due to pain    Time  6    Period  Weeks    Status  New    Target Date  06/12/19            Plan - 05/27/19 0842    Clinical Impression Statement  Added standing hip abduction with Theraband as alternative/progression from sidelying bodyweight exercise and continued CKC exercises for hip/glut strengthening. Continued ionto and for dry needling added QL given lateral hpi region pain as possible referral pattern for pain. Fair progress re: therapy goals with ongoing symptoms.    Personal Factors and Comorbidities  Comorbidity 1;Time since onset of injury/illness/exacerbation    Comorbidities  h/o Rt tibia spiral fracture    Examination-Activity Limitations  Locomotion Level;Transfers;Bed Mobility;Bend;Sit;Sleep;Squat;Stairs;Stand    Examination-Participation Restrictions  Cleaning;Driving    Stability/Clinical Decision Making  Evolving/Moderate complexity    Clinical Decision Making  Moderate    Rehab Potential  Good    PT Frequency  2x / week    PT Duration  6 weeks    PT Treatment/Interventions  ADLs/Self Care Home Management;Cryotherapy;Electrical Stimulation;Ultrasound;Traction;Moist Heat;Iontophoresis 4mg /ml Dexamethasone;Gait training;Stair training;Functional mobility training;Therapeutic activities;Therapeutic exercise;Balance training;Patient/family education;Neuromuscular re-education;Manual techniques;Passive range of motion;Dry needling;Spinal Manipulations;Taping;Joint Manipulations    PT Next Visit Plan  hip mobs, ionto, continue hip strengthening, stretches, further dry needling prn, ergonomocs for work    PT Home Exercise Plan  iso Lt hip flx PRN, hooklying adduction, seated  HSS, LTR, planks,  slidelying vs. standing hip abduction with Theraband    Consulted and Agree with Plan of Care  Patient       Patient will benefit from skilled therapeutic intervention in order to improve the following deficits and impairments:  Decreased range of motion, Difficulty walking, Increased muscle spasms, Decreased activity tolerance, Pain, Improper body mechanics, Impaired flexibility, Postural dysfunction  Visit Diagnosis: Pain in right hip  Difficulty in walking, not elsewhere classified  Abnormal posture     Problem List Patient Active Problem List   Diagnosis Date Noted  . 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 Acute sinus infection 08/13/2018  . Allergic rhinitis 08/13/2018  . Anxiety 01/05/2017  . Acute pharyngitis 05/10/2016  . Routine general medical examination at a health care facility 03/15/2016    Lazarus Gowda, PT, DPT 05/27/19 8:49 AM  [facility] [address] [place], [place], [address] Phone: [phone]   Fax:  [phone]  Name: Ricardo Knight MRN: [id number] Date of Birth: [birth year]

## 2019-06-01 ENCOUNTER — Encounter: Payer: Self-pay | Admitting: Physical Therapy

## 2019-06-01 ENCOUNTER — Ambulatory Visit: Payer: BLUE CROSS/BLUE SHIELD | Admitting: Physical Therapy

## 2019-06-01 ENCOUNTER — Other Ambulatory Visit: Payer: Self-pay

## 2019-06-01 DIAGNOSIS — R293 Abnormal posture: Secondary | ICD-10-CM | POA: Diagnosis not present

## 2019-06-01 DIAGNOSIS — M25551 Pain in right hip: Secondary | ICD-10-CM | POA: Diagnosis not present

## 2019-06-01 DIAGNOSIS — R262 Difficulty in walking, not elsewhere classified: Secondary | ICD-10-CM | POA: Diagnosis not present

## 2019-06-01 NOTE — Therapy (Signed)
[place], [place], [address] Phone: [phone]   Fax:  [phone]  Physical Therapy Treatment  Patient Details  Name: Ricardo Knight MRN: [id number] Date of Birth: 10-May-1990 Referring Provider (PT): Gregor Hams, MD   Encounter Date: 06/01/2019  PT End of Session - 06/01/19 0807    Visit Number  14    Number of Visits  21    Date for PT Re-Evaluation  06/12/19    Authorization Type  BCBS    PT Start Time  0803    PT Stop Time  0844    PT Time Calculation (min)  41 min    Activity Tolerance  Patient tolerated treatment well    Behavior During Therapy  [facility] for tasks assessed/performed       Past Medical History:  Diagnosis Date  . Anxiety   . Depression   . Exercise-induced asthma   . Heart palpitations   . History of chicken pox     Past Surgical History:  Procedure Laterality Date  . WISDOM TOOTH EXTRACTION      There were no vitals filed for this visit.  Subjective Assessment - 06/01/19 0806    Subjective  Pt. reports progression of hip strengthening/resisted abduction has been helpful for pain. 3/10 pain this AM.    Currently in Pain?  Yes    Pain Score  3     Pain Location  Hip    Pain Orientation  Right    Pain Descriptors / Indicators  Sharp    Pain Type  Chronic pain    Pain Frequency  Intermittent    Aggravating Factors   varies, prolonged standing    Pain Relieving Factors  stretches, exercises                       [facility] Adult PT Treatment/Exercise - 06/01/19 0001      Knee/Hip Exercises: Stretches   Passive Hamstring Stretch  Right;3 reps;30 seconds    Piriformis Stretch  Right;3 reps;20 seconds      Knee/Hip Exercises: Aerobic   Elliptical  R3 L4 x 5 min      Knee/Hip Exercises: Standing   Lateral Step Up  Right;Left;2 sets;10 reps;Step Height: 8"    Lateral Step Up Limitations  6 lb. DB ea. UE    Forward Step Up Limitations  front step up to 8  in. step with opp hip hike 2x10 bilat.   both sides, 6 lb. DB ea. UE   Functional Squat Limitations  front squat with 10 lb. KB 2x10 with touch to incline wedge on low mat    SLS  3x10-15 sec holds ea. bilat. on Airex    Other Standing Knee Exercises  hip abduction isometric at wall x 10 ea. side    Other Standing Knee Exercises  TRX reverse lunge x 15 ea. bilat., Monster walk blue band 20 feet x 2 band at ankles, sidestepping withblue Theraband at ankles 20 feet x 2      Iontophoresis   Type of Iontophoresis  Dexamethasone    Location  Rt gr. trochanter    Dose  1cc    Time  6hr wear      Manual Therapy   Joint Mobilization  prone grade III-IV PA in figure 4 position    Soft tissue mobilization  IASTM right glut and hamstring  PT Long Term Goals - 04/29/19 0820      PT LONG TERM GOAL #1   Title  HS flexibility to at least 60 deg    Status  On-going      PT LONG TERM GOAL #2   Title  Pt will be able to sit comfortably on a couch for at least 20 min    Baseline  I can re-adjust better    Status  Achieved      PT LONG TERM GOAL #3   Title  able to ambulate without limping    Status  Achieved      PT LONG TERM GOAL #4   Title  able to don socks/shoes without limitation    Baseline  still a little tight    Status  On-going      PT LONG TERM GOAL #5   Title  FOTO to 24% limited    Baseline  44% limited    Status  On-going      Additional Long Term Goals   Additional Long Term Goals  Yes      PT LONG TERM GOAL #6   Title  able to return to walking for exercising    Baseline  has been avoiding    Time  6    Period  Weeks    Status  New    Target Date  06/12/19      PT LONG TERM GOAL #7   Title  return to gym lifting weights    Baseline  avoiding due to pain    Time  6    Period  Weeks    Status  New    Target Date  06/12/19            Plan - 06/01/19 0809    Clinical Impression Statement  Responding well to tx. with gradual  gains for decreased hip pain/improving activity tolerance. More exercise focus today for continued progression of hip strengthening with good tolerance. Progress ongoing re: therapy goals.    Personal Factors and Comorbidities  Comorbidity 1;Time since onset of injury/illness/exacerbation    Comorbidities  h/o Rt tibia spiral fracture    Examination-Activity Limitations  Locomotion Level;Transfers;Bed Mobility;Bend;Sit;Sleep;Squat;Stairs;Stand    Examination-Participation Restrictions  Cleaning;Driving    Stability/Clinical Decision Making  Evolving/Moderate complexity    Clinical Decision Making  Moderate    Rehab Potential  Good    PT Frequency  2x / week    PT Duration  6 weeks    PT Treatment/Interventions  ADLs/Self Care Home Management;Cryotherapy;Electrical Stimulation;Ultrasound;Traction;Moist Heat;Iontophoresis 4mg /ml Dexamethasone;Gait training;Stair training;Functional mobility training;Therapeutic activities;Therapeutic exercise;Balance training;Patient/family education;Neuromuscular re-education;Manual techniques;Passive range of motion;Dry needling;Spinal Manipulations;Taping;Joint Manipulations    PT Next Visit Plan  hip mobs, ionto, continue hip strengthening, stretches, further dry needling prn, ergonomocs for work    PT Home Exercise Plan  iso Lt hip flx PRN, hooklying adduction, seated HSS, LTR, planks, slidelying vs. standing hip abduction with Theraband    Consulted and Agree with Plan of Care  Patient       Patient will benefit from skilled therapeutic intervention in order to improve the following deficits and impairments:  Decreased range of motion, Difficulty walking, Increased muscle spasms, Decreased activity tolerance, Pain, Improper body mechanics, Impaired flexibility, Postural dysfunction  Visit Diagnosis: Pain in right hip  Difficulty in walking, not elsewhere classified  Abnormal posture     Problem List Patient Active Problem List   Diagnosis Date  Noted  . 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  Acute sinus infection 08/13/2018  . Allergic rhinitis 08/13/2018  . Anxiety 01/05/2017  . Acute pharyngitis 05/10/2016  . Routine general medical examination at a health care facility 03/15/2016    Lazarus Gowda, PT, DPT 06/01/19 8:45 AM  [facility] [address] [place], [place], [address] Phone: [phone]   Fax:  [phone]  Name: Ricardo Knight MRN: [id number] Date of Birth: 1990-10-06

## 2019-06-03 ENCOUNTER — Ambulatory Visit: Payer: BLUE CROSS/BLUE SHIELD | Admitting: Physical Therapy

## 2019-06-03 ENCOUNTER — Other Ambulatory Visit: Payer: Self-pay

## 2019-06-03 ENCOUNTER — Encounter: Payer: Self-pay | Admitting: Physical Therapy

## 2019-06-03 DIAGNOSIS — M25551 Pain in right hip: Secondary | ICD-10-CM | POA: Diagnosis not present

## 2019-06-03 DIAGNOSIS — R262 Difficulty in walking, not elsewhere classified: Secondary | ICD-10-CM | POA: Diagnosis not present

## 2019-06-03 DIAGNOSIS — R293 Abnormal posture: Secondary | ICD-10-CM | POA: Diagnosis not present

## 2019-06-03 NOTE — Therapy (Signed)
[place], [place], [address] Phone: [phone]   Fax:  [phone]  Physical Therapy Treatment  Patient Details  Name: Ricardo Knight MRN: [id number] Date of Birth: Apr 14, 1990 Referring Provider (PT): Gregor Hams, MD   Encounter Date: 06/03/2019  PT End of Session - 06/03/19 0804    Visit Number  15    Number of Visits  21    Date for PT Re-Evaluation  06/12/19    Authorization Type  BCBS    PT Start Time  0802    PT Stop Time  0843    PT Time Calculation (min)  41 min    Activity Tolerance  Patient tolerated treatment well    Behavior During Therapy  [facility] for tasks assessed/performed       Past Medical History:  Diagnosis Date  . Anxiety   . Depression   . Exercise-induced asthma   . Heart palpitations   . History of chicken pox     Past Surgical History:  Procedure Laterality Date  . WISDOM TOOTH EXTRACTION      There were no vitals filed for this visit.  Subjective Assessment - 06/03/19 0803    Subjective  Exercises still seem to be taking things in the right direction. Still noting c/o hamstring tightness.                       [facility] PT Treatment/Exercise - 06/03/19 0001      Knee/Hip Exercises: Stretches   Passive Hamstring Stretch  Right;3 reps;30 seconds    Piriformis Stretch  Right;3 reps;30 seconds      Knee/Hip Exercises: Aerobic   Elliptical  R4-5L4-5 x 5 min      Knee/Hip Exercises: Machines for Strengthening   Other Machine  Omega Leg Press 65 lbs. x 10-switcthed to Cybex due to limited ROM from nerve tension/hamstring tightness and perofrmed 80 lbs. 2x10      Knee/Hip Exercises: Standing   Other Standing Knee Exercises  sidestepping hip abd squat with blue band 2 x 20 feet ea. way    Other Standing Knee Exercises  TRX cursty lunge x 10 ea. bilat.      Knee/Hip Exercises: Seated   Other Seated Knee/Hip Exercises  seated dural nerve floss x  10 repsa      Knee/Hip Exercises: Supine   Bridges Limitations  2x10 calves on 55 cm P-ball    Other Supine Knee/Hip Exercises  supine dural nerve floss x 10 reps      Knee/Hip Exercises: Sidelying   Other Sidelying Knee/Hip Exercises  side half plank with hip abd SLR x 10 ea. bilat.      Knee/Hip Exercises: Prone   Other Prone Exercises  "firehydrant x 10 ea. bilat. from quadruped    Other Prone Exercises  prone press up x 10 reps      Iontophoresis   Type of Iontophoresis  Dexamethasone    Location  Rt gr. trochanter    Dose  1cc    Time  6hr wear      Manual Therapy   Soft tissue mobilization  IASTM right glut and hamstring             PT Education - 06/03/19 0832    Education Details  HEP updates/progression, exercises    Person(s) Educated  Patient    Methods  Explanation;Demonstration;Verbal cues;Handout    Comprehension  Returned demonstration;Verbalized understanding  PT Long Term Goals - 04/29/19 0820      PT LONG TERM GOAL #1   Title  HS flexibility to at least 60 deg    Status  On-going      PT LONG TERM GOAL #2   Title  Pt will be able to sit comfortably on a couch for at least 20 min    Baseline  I can re-adjust better    Status  Achieved      PT LONG TERM GOAL #3   Title  able to ambulate without limping    Status  Achieved      PT LONG TERM GOAL #4   Title  able to don socks/shoes without limitation    Baseline  still a little tight    Status  On-going      PT LONG TERM GOAL #5   Title  FOTO to 24% limited    Baseline  44% limited    Status  On-going      Additional Long Term Goals   Additional Long Term Goals  Yes      PT LONG TERM GOAL #6   Title  able to return to walking for exercising    Baseline  has been avoiding    Time  6    Period  Weeks    Status  New    Target Date  06/12/19      PT LONG TERM GOAL #7   Title  return to gym lifting weights    Baseline  avoiding due to pain    Time  6    Period  Weeks     Status  New    Target Date  06/12/19            Plan - 06/03/19 0843    Clinical Impression Statement  Progressed HEP with further hip abd core strengthening and also reviewed HEP for dural nerve glide to help address neural tension component of limited hamstring flexibility. Fair progress with therapy goals but has been makimg steady progress with reduced symtpoms.    Personal Factors and Comorbidities  Comorbidity 1;Time since onset of injury/illness/exacerbation    Comorbidities  h/o Rt tibia spiral fracture    Examination-Activity Limitations  Locomotion Level;Transfers;Bed Mobility;Bend;Sit;Sleep;Squat;Stairs;Stand    Examination-Participation Restrictions  Cleaning;Driving    Stability/Clinical Decision Making  Evolving/Moderate complexity    Clinical Decision Making  Moderate    Rehab Potential  Good    PT Frequency  2x / week    PT Duration  6 weeks    PT Treatment/Interventions  ADLs/Self Care Home Management;Cryotherapy;Electrical Stimulation;Ultrasound;Traction;Moist Heat;Iontophoresis 4mg /ml Dexamethasone;Gait training;Stair training;Functional mobility training;Therapeutic activities;Therapeutic exercise;Balance training;Patient/family education;Neuromuscular re-education;Manual techniques;Passive range of motion;Dry needling;Spinal Manipulations;Taping;Joint Manipulations    PT Next Visit Plan  hip mobs, ionto, continue hip strengthening, stretches, further dry needling prn, ergonomocs for work    PT Home Exercise Plan  iso Lt hip flx PRN, hooklying adduction, seated HSS, LTR, planks, slidelying vs. standing hip abduction with Theraband    Consulted and Agree with Plan of Care  Patient       Patient will benefit from skilled therapeutic intervention in order to improve the following deficits and impairments:  Decreased range of motion, Difficulty walking, Increased muscle spasms, Decreased activity tolerance, Pain, Improper body mechanics, Impaired flexibility, Postural  dysfunction  Visit Diagnosis: Pain in right hip  Difficulty in walking, not elsewhere classified  Abnormal posture     Problem List Patient Active Problem List  Diagnosis Date Noted  . 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 Acute sinus infection 08/13/2018  . Allergic rhinitis 08/13/2018  . Anxiety 01/05/2017  . Acute pharyngitis 05/10/2016  . Routine general medical examination at a health care facility 03/15/2016    Lazarus Gowda, PT, DPT 06/03/19 8:47 AM  [facility] [address] [place], [place], [address] Phone: [phone]   Fax:  [phone]  Name: Dallen Bunte MRN: [id number] Date of Birth: 20-Jul-1990

## 2019-06-10 ENCOUNTER — Ambulatory Visit: Payer: BLUE CROSS/BLUE SHIELD | Admitting: Physical Therapy

## 2019-06-10 ENCOUNTER — Other Ambulatory Visit: Payer: Self-pay

## 2019-06-10 ENCOUNTER — Encounter: Payer: Self-pay | Admitting: Physical Therapy

## 2019-06-10 DIAGNOSIS — M25551 Pain in right hip: Secondary | ICD-10-CM | POA: Diagnosis not present

## 2019-06-10 DIAGNOSIS — R262 Difficulty in walking, not elsewhere classified: Secondary | ICD-10-CM | POA: Diagnosis not present

## 2019-06-10 DIAGNOSIS — R293 Abnormal posture: Secondary | ICD-10-CM | POA: Diagnosis not present

## 2019-06-10 NOTE — Therapy (Signed)
[facility] [address] [place], [place], [address] Phone: [phone]   Fax:  [phone]  Physical Therapy Treatment  Patient Details  Name: Ricardo Knight MRN: [id number] Date of Birth: [birth year] Referring Provider (PT): Rodolph Bong, MD   Encounter Date: 06/10/2019  PT End of Session - 06/10/19 0816    Visit Number  16    Number of Visits  21    Date for PT Re-Evaluation  06/12/19    Authorization Type  BCBS    PT Start Time  0803    PT Stop Time  0845    PT Time Calculation (min)  42 min    Activity Tolerance  Patient tolerated treatment well    Behavior During Therapy  [facility] for tasks assessed/performed       Past Medical History:  Diagnosis Date  . Anxiety   . Depression   . Exercise-induced asthma   . Heart palpitations   . History of chicken pox     Past Surgical History:  Procedure Laterality Date  . WISDOM TOOTH EXTRACTION      There were no vitals filed for this visit.  Subjective Assessment - 06/10/19 0803    Subjective  Pain a little different since last visit with some intermittent symptoms into calf. Pain was mostly into hip from the end of lat week into Monday with improved ability to walk but exacerbated yesterday-may have slept on layed down on area in a manner that increased pain otherwise no specific exacerbating cause noted.    Currently in Pain?  Yes    Pain Score  4     Pain Location  Calf    Pain Orientation  Right    Pain Descriptors / Indicators  Sharp    Pain Type  Chronic pain    Pain Onset  In the past 7 days    Pain Frequency  Intermittent    Aggravating Factors   walking, laying on right side    Effect of Pain on Daily Activities  limits walking and standing tolerance         [facility] PT Assessment - 06/10/19 0001      Palpation   SI assessment   R anterior innominate rotation noted                   [facility] Adult PT Treatment/Exercise - 06/10/19 0001      Knee/Hip Exercises: Aerobic   Elliptical  R5-6 L5-6 x 5 min      Knee/Hip Exercises: Machines for Strengthening   Cybex Leg Press  80 lbs. bilat. LE 2x10    Hip Cybex  hip abd 12.5 lbs. 1st set, 25 lbs. 2nd set 2x10 ea. bilat.,      Knee/Hip Exercises: Standing   Step Down  Both;2 sets;10 reps;Hand Hold: 0;Step Height: 6"    Step Down Limitations  eccentric lateral stepdown    Functional Squat Limitations  KB front squat 2x10 with 25 lbs.    Other Standing Knee Exercises  sidestepping hip abd with blue band 20 feet ea. way x 2      Manual Therapy   Soft tissue mobilization  STM right gluteus medius region    Muscle Energy Technique  "shotgun" 5x5 sec ea., right hamstring/left hip flexion isometrics 2x5 x 5 sec holds ea.       Trigger Point Dry Needling - 06/10/19 0001    Consent Given?  Yes    Muscles Treated Back/Hip  Gluteus medius;Piriformis    Dry Needling Comments  right side needled in left sidelying with 30 gauge 50 mm needles    Electrical Stimulation Performed with Dry Needling  Yes    E-stim with Dry Needling Details  TENS 20 pps x 10 minutes           PT Education - 06/10/19 0839    Education Details  SI/METs for home    Person(s) Educated  Patient    Methods  Explanation;Demonstration;Verbal cues    Comprehension  Verbalized understanding;Returned demonstration          PT Long Term Goals - 04/29/19 0820      PT LONG TERM GOAL #1   Title  HS flexibility to at least 60 deg    Status  On-going      PT LONG TERM GOAL #2   Title  Pt will be able to sit comfortably on a couch for at least 20 min    Baseline  I can re-adjust better    Status  Achieved      PT LONG TERM GOAL #3   Title  able to ambulate without limping    Status  Achieved      PT LONG TERM GOAL #4   Title  able to don socks/shoes without limitation    Baseline  still a little tight    Status  On-going      PT LONG TERM GOAL #5   Title  FOTO to 24% limited    Baseline  44% limited     Status  On-going      Additional Long Term Goals   Additional Long Term Goals  Yes      PT LONG TERM GOAL #6   Title  able to return to walking for exercising    Baseline  has been avoiding    Time  6    Period  Weeks    Status  New    Target Date  06/12/19      PT LONG TERM GOAL #7   Title  return to gym lifting weights    Baseline  avoiding due to pain    Time  6    Period  Weeks    Status  New    Target Date  06/12/19            Plan - 06/10/19 0840    Clinical Impression Statement  Check SI joint re: pain noted extending into calf with right anterior innominate rotation noted. Difficulty assessing longsitting test due to hamstring tightness but no rotation noted after METs/able to correct. Otherwise suspect abductor tendinopathy so tx. focus exercises and re-added dry needling to address. Fair progress re: therapy goals.    Personal Factors and Comorbidities  Comorbidity 1;Time since onset of injury/illness/exacerbation    Comorbidities  h/o Rt tibia spiral fracture    Examination-Activity Limitations  Locomotion Level;Transfers;Bed Mobility;Bend;Sit;Sleep;Squat;Stairs;Stand    Examination-Participation Restrictions  Cleaning;Driving    Stability/Clinical Decision Making  Evolving/Moderate complexity    Clinical Decision Making  Moderate    Rehab Potential  Good    PT Frequency  2x / week    PT Duration  6 weeks    PT Treatment/Interventions  ADLs/Self Care Home Management;Cryotherapy;Electrical Stimulation;Ultrasound;Traction;Moist Heat;Iontophoresis 4mg /ml Dexamethasone;Gait training;Stair training;Functional mobility training;Therapeutic activities;Therapeutic exercise;Balance training;Patient/family education;Neuromuscular re-education;Manual techniques;Passive range of motion;Dry needling;Spinal Manipulations;Taping;Joint Manipulations    PT Next Visit Plan  ERO next session, hip mobs, ionto, continue hip strengthening, stretches, further dry needling  prn,  ergonomics for work    PT Home Exercise Plan  iso Lt hip flx PRN, hooklying adduction, seated HSS, LTR, planks, slidelying vs. standing hip abduction with Theraband    Consulted and Agree with Plan of Care  Patient       Patient will benefit from skilled therapeutic intervention in order to improve the following deficits and impairments:  Decreased range of motion, Difficulty walking, Increased muscle spasms, Decreased activity tolerance, Pain, Improper body mechanics, Impaired flexibility, Postural dysfunction  Visit Diagnosis: Pain in right hip  Difficulty in walking, not elsewhere classified  Abnormal posture     Problem List Patient Active Problem List   Diagnosis Date Noted  . 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 Acute sinus infection 08/13/2018  . Allergic rhinitis 08/13/2018  . Anxiety 01/05/2017  . Acute pharyngitis 05/10/2016  . Routine general medical examination at a health care facility 03/15/2016    Lazarus Gowda, PT, DPT 06/10/19 8:43 AM  [facility] [address] [place], [place], [address] Phone: [phone]   Fax:  [phone]  Name: Ricardo Knight MRN: [id number] Date of Birth: 1990/03/30

## 2019-06-12 ENCOUNTER — Ambulatory Visit: Payer: BLUE CROSS/BLUE SHIELD | Admitting: Physical Therapy

## 2019-06-12 ENCOUNTER — Other Ambulatory Visit: Payer: Self-pay

## 2019-06-12 ENCOUNTER — Encounter: Payer: Self-pay | Admitting: Physical Therapy

## 2019-06-12 DIAGNOSIS — R262 Difficulty in walking, not elsewhere classified: Secondary | ICD-10-CM | POA: Diagnosis not present

## 2019-06-12 DIAGNOSIS — R293 Abnormal posture: Secondary | ICD-10-CM | POA: Diagnosis not present

## 2019-06-12 DIAGNOSIS — M25551 Pain in right hip: Secondary | ICD-10-CM

## 2019-06-12 NOTE — Therapy (Signed)
[facility] [address] [place], [place], [address] Phone: [phone]   Fax:  [phone]  Physical Therapy Treatment  Patient Details  Name: Ricardo Knight MRN: [id number] Date of Birth: Nov 08, 1990 Referring Provider (PT): Rodolph Bong, MD   Encounter Date: 06/12/2019  PT End of Session - 06/12/19 0804    Visit Number  17    Number of Visits  21    Date for PT Re-Evaluation  06/12/19    Authorization Type  BCBS    PT Start Time  0803    PT Stop Time  0842    PT Time Calculation (min)  39 min    Activity Tolerance  Patient tolerated treatment well    Behavior During Therapy  [facility] for tasks assessed/performed       Past Medical History:  Diagnosis Date  . Anxiety   . Depression   . Exercise-induced asthma   . Heart palpitations   . History of chicken pox     Past Surgical History:  Procedure Laterality Date  . WISDOM TOOTH EXTRACTION      There were no vitals filed for this visit.  Subjective Assessment - 06/12/19 0806    Subjective  "Not 100%" but reports METs last session helped with symptoms. Feels likde overall pain continues to improve but still with intermittent symptoms.         [facility] PT Assessment - 06/12/19 0001      Ambulation/Gait   Gait Comments  Lacks terminal knee extension and heel strike on right                   [facility] Adult PT Treatment/Exercise - 06/12/19 0001      Knee/Hip Exercises: Stretches   Passive Hamstring Stretch  Right;3 reps;30 seconds    Piriformis Stretch  Right;3 reps;30 seconds      Knee/Hip Exercises: Aerobic   Elliptical  R6 L6 x 5 min      Knee/Hip Exercises: Machines for Strengthening   Cybex Leg Press  80 lbs. bilat. LE 2x10    Hip Cybex  hip abd 25 lbs. x 10, 37.5 lbs. x 10 bilat.       Knee/Hip Exercises: Standing   Terminal Knee Extension  AROM;Strengthening;Right;2 sets;10 reps    Theraband Level (Terminal Knee Extension)  Level 4 (Blue)     Terminal Knee Extension Limitations  right foot on 2in. step    Forward Step Up  Right;Left;2 sets;10 reps;Step Height: 8"    Forward Step Up Limitations  8 lb. DB ea. UE    Step Down  Both;2 sets;10 reps;Hand Hold: 0;Step Height: 6"    Step Down Limitations  eccentric lateral stepdown    Functional Squat Limitations  KB front squat 2x10 with 25 lbs.    Other Standing Knee Exercises  sidestepping hip abd with blue band 20 feet ea. way x 2      Knee/Hip Exercises: Prone   Other Prone Exercises  prone press up with right leg in "figure 4" position 2x10      Manual Therapy   Joint Mobilization  LAD right hip grade I-III oscillations    Muscle Energy Technique  "shotgun" 5x5 sec ea., right hamstring/left hip flexion isometrics 2x5 x 5 sec holds ea.             PT Education - 06/12/19 [phone]    Education Details  POC    Person(s) Educated  Patient    Methods  Explanation    Comprehension  Verbalized understanding          PT Long Term Goals - 04/29/19 0820      PT LONG TERM GOAL #1   Title  HS flexibility to at least 60 deg    Status  On-going      PT LONG TERM GOAL #2   Title  Pt will be able to sit comfortably on a couch for at least 20 min    Baseline  I can re-adjust better    Status  Achieved      PT LONG TERM GOAL #3   Title  able to ambulate without limping    Status  Achieved      PT LONG TERM GOAL #4   Title  able to don socks/shoes without limitation    Baseline  still a little tight    Status  On-going      PT LONG TERM GOAL #5   Title  FOTO to 24% limited    Baseline  44% limited    Status  On-going      Additional Long Term Goals   Additional Long Term Goals  Yes      PT LONG TERM GOAL #6   Title  able to return to walking for exercising    Baseline  has been avoiding    Time  6    Period  Weeks    Status  New    Target Date  06/12/19      PT LONG TERM GOAL #7   Title  return to gym lifting weights    Baseline  avoiding due to pain     Time  6    Period  Weeks    Status  New    Target Date  06/12/19            Plan - 06/12/19 0814    Clinical Impression Statement  Gradual improvement as previously but still with intermittent right posterolateral hip pain and intermittent RLE radicular pain. Also has difficulty with terminal knee extension on right at initial contact phase for gait particularly in AM causing difficulty walking-see plan for next visit below.    Personal Factors and Comorbidities  Comorbidity 1;Time since onset of injury/illness/exacerbation    Comorbidities  h/o Rt tibia spiral fracture    Examination-Activity Limitations  Locomotion Level;Transfers;Bed Mobility;Bend;Sit;Sleep;Squat;Stairs;Stand    Examination-Participation Restrictions  Cleaning;Driving    Stability/Clinical Decision Making  Evolving/Moderate complexity    Clinical Decision Making  Moderate    Rehab Potential  Good    PT Frequency  2x / week    PT Duration  6 weeks    PT Treatment/Interventions  ADLs/Self Care Home Management;Cryotherapy;Electrical Stimulation;Ultrasound;Traction;Moist Heat;Iontophoresis 4mg /ml Dexamethasone;Gait training;Stair training;Functional mobility training;Therapeutic activities;Therapeutic exercise;Balance training;Patient/family education;Neuromuscular re-education;Manual techniques;Passive range of motion;Dry needling;Spinal Manipulations;Taping;Joint Manipulations    PT Next Visit Plan  ERO next session-tentatively plan hold PT after next session and see how HEP goes, hip mobs, ionto, continue hip strengthening, stretches, further dry needling prn, ergonomics for work    PT Home Exercise Plan  iso Lt hip flx PRN, hooklying adduction, seated HSS, LTR, planks, slidelying vs. standing hip abduction with Theraband    Consulted and Agree with Plan of Care  Patient       Patient will benefit from skilled therapeutic intervention in order to improve the following deficits and impairments:  Decreased range of  motion, Difficulty walking, Increased muscle spasms, Decreased activity tolerance, Pain, Improper body  mechanics, Impaired flexibility, Postural dysfunction  Visit Diagnosis: Pain in right hip  Difficulty in walking, not elsewhere classified  Abnormal posture     Problem List Patient Active Problem List   Diagnosis Date Noted  . 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 Acute sinus infection 08/13/2018  . Allergic rhinitis 08/13/2018  . Anxiety 01/05/2017  . Acute pharyngitis 05/10/2016  . Routine general medical examination at a health care facility 03/15/2016    Lazarus Gowda, PT, DPT 06/12/19 8:43 AM  [facility] [address] [place], [place], [address] Phone: [phone]   Fax:  [phone]  Name: Braxtyn Bojarski MRN: [id number] Date of Birth: 01-23-1991

## 2019-06-15 ENCOUNTER — Ambulatory Visit: Payer: BLUE CROSS/BLUE SHIELD | Admitting: Physical Therapy

## 2019-06-17 ENCOUNTER — Other Ambulatory Visit: Payer: Self-pay

## 2019-06-17 ENCOUNTER — Ambulatory Visit: Payer: BLUE CROSS/BLUE SHIELD | Admitting: Physical Therapy

## 2019-06-17 ENCOUNTER — Encounter: Payer: Self-pay | Admitting: Physical Therapy

## 2019-06-17 DIAGNOSIS — R293 Abnormal posture: Secondary | ICD-10-CM

## 2019-06-17 DIAGNOSIS — R262 Difficulty in walking, not elsewhere classified: Secondary | ICD-10-CM

## 2019-06-17 DIAGNOSIS — M25551 Pain in right hip: Secondary | ICD-10-CM

## 2019-06-17 NOTE — Therapy (Addendum)
[place], [place], [address] Phone: [phone]   Fax:  [phone]  Physical Therapy Treatment/Recertification/Discharge  Patient Details  Name: Miki Blank MRN: [id number] Date of Birth: Mar 11, 1991 Referring Provider (PT): Gregor Hams, MD   Encounter Date: 06/17/2019  PT End of Session - 06/17/19 0808    Visit Number  18    Number of Visits  26    Date for PT Re-Evaluation  07/29/19    Authorization Type  BC[employer]    PT Start Time  0805    PT Stop Time  0845    PT Time Calculation (min)  40 min    Activity Tolerance  Patient tolerated treatment well    Behavior During Therapy  [facility] for tasks assessed/performed       Past Medical History:  Diagnosis Date  . Anxiety   . Depression   . Exercise-induced asthma   . Heart palpitations   . History of chicken pox     Past Surgical History:  Procedure Laterality Date  . WISDOM TOOTH EXTRACTION      There were no vitals filed for this visit.  Subjective Assessment - 06/17/19 0805    Subjective  "Rough couple days" prior to today's therapy session with pain in right posterolateral hip and calf. Did OK for a couple days after last session but symptoms since exacerbated. Noting some sleep disturbance also which he had not been having.    Currently in Pain?  Yes    Pain Score  6     Pain Location  Hip    Pain Orientation  Right    Pain Descriptors / Indicators  Sharp    Pain Type  Chronic pain    Pain Onset  More than a month ago    Pain Frequency  Intermittent    Aggravating Factors   prolonged standing    Pain Relieving Factors  stretches, exercises, better with sitting than standing    Effect of Pain on Daily Activities  limits positional tolerance         [facility] PT Assessment - 06/17/19 0001      Observation/Other Assessments   Focus on Therapeutic Outcomes (FOTO)   38% limited      Strength   Overall Strength Comments  Right hip  strength grossly 5/5      Flexibility   Hamstrings  Right SLR 50 deg      Palpation   SI assessment   Right anterior innnominate rotation noted                   [facility] Adult PT Treatment/Exercise - 06/17/19 0001      Knee/Hip Exercises: Stretches   Passive Hamstring Stretch  Right;3 reps;30 seconds    Piriformis Stretch  Right;3 reps;30 seconds      Knee/Hip Exercises: Aerobic   Elliptical  R6 L6 x 5 min      Knee/Hip Exercises: Prone   Other Prone Exercises  prone press up with right leg in figure 4 position x 15 reps      Manual Therapy   Joint Mobilization  LAD right hip grade I-III oscillations    Soft tissue mobilization  Right posterolateral hip    Muscle Energy Technique  right hamstring/left hip flexion isometrics 2x5 x 5 sec holds ea.       Trigger Point Dry Needling - 06/17/19 0001    Consent Given?  Yes    Muscles Treated Back/Hip  Gluteus medius;Piriformis    Dry Needling Comments  right side needled in left sidelying with 30 gauge 50 mm needles    Electrical Stimulation Performed with Dry Needling  Yes    E-stim with Dry Needling Details  TENS 2 pps x 10 minutes           PT Education - 06/17/19 0838    Education Details  POC, HEP    Person(s) Educated  Patient    Methods  Explanation    Comprehension  Verbalized understanding          PT Long Term Goals - 06/17/19 0817      PT LONG TERM GOAL #1   Title  HS flexibility to at least 60 deg    Baseline  50 deg    Time  6    Period  Weeks    Status  On-going    Target Date  07/29/19      PT LONG TERM GOAL #2   Title  Pt will be able to sit comfortably on a couch for at least 20 min    Baseline  I can re-adjust better    Time  6    Period  Weeks    Status  Achieved      PT LONG TERM GOAL #3   Title  able to ambulate without limping    Baseline  intermittently still limping but improving from baseline    Time  6    Period  Weeks    Status  On-going    Target Date  07/29/19       PT LONG TERM GOAL #4   Title  able to don socks/shoes without limitation    Baseline  met/able    Time  6    Period  Weeks    Status  Achieved      PT LONG TERM GOAL #5   Title  FOTO to 24% limited    Baseline  38% limited    Time  6    Period  Weeks    Status  On-going    Target Date  07/29/19      PT LONG TERM GOAL #6   Title  able to return to walking for exercising    Baseline  able/met but still some limitation pending pain with prolonged walking    Time  6    Period  Weeks    Status  Achieved      PT LONG TERM GOAL #7   Title  return to gym lifting weights    Baseline  met    Time  6    Period  Weeks    Status  Achieved            Plan - 06/17/19 4709    Clinical Impression Statement  Fair progress with recent therapy. Pt. has made improvements from baseline status with decreased pain and improving positional + walking tolerance but still with limitations for prolonged standing and ambulation. Differential diagnosis could include hip tendinopathy or piriformis syndrome vs. SI dysfunction or lumbar radicular etiology. Plan have pt. try continuing via HEP for the next several weeks iwth POC to allow for return if needed. If symptoms fail to improve advise MD follow up for further assessment.    Personal Factors and Comorbidities  Comorbidity 1;Time since onset of injury/illness/exacerbation    Comorbidities  h/o Rt tibia spiral fracture    Examination-Activity Limitations  Locomotion Level;Transfers;Bed Mobility;Bend;Sit;Sleep;Squat;Stairs;Stand    Examination-Participation  Restrictions  Cleaning;Driving    Stability/Clinical Decision Making  Evolving/Moderate complexity    Clinical Decision Making  Moderate    Rehab Potential  Good    PT Frequency  2x / week    PT Duration  6 weeks    PT Treatment/Interventions  ADLs/Self Care Home Management;Cryotherapy;Electrical Stimulation;Ultrasound;Traction;Moist Heat;Iontophoresis 24m/ml Dexamethasone;Gait  training;Stair training;Functional mobility training;Therapeutic activities;Therapeutic exercise;Balance training;Patient/family education;Neuromuscular re-education;Manual techniques;Passive range of motion;Dry needling;Spinal Manipulations;Taping;Joint Manipulations    PT Next Visit Plan  if returning check SI for innominate rotation, manual to hip, strengthening/stretches hamstring and piriformis as tolerated    PT Home Exercise Plan  self METs, PPU, hip abduction with Theraband vs. sidelying, stretches piriformis and hamstring, right LE nerve glides, squats, step ups    Consulted and Agree with Plan of Care  Patient       Patient will benefit from skilled therapeutic intervention in order to improve the following deficits and impairments:  Decreased range of motion, Difficulty walking, Increased muscle spasms, Decreased activity tolerance, Pain, Improper body mechanics, Impaired flexibility, Postural dysfunction  Visit Diagnosis: Pain in right hip  Difficulty in walking, not elsewhere classified  Abnormal posture     Problem List Patient Active Problem List   Diagnosis Date Noted  . 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 Acute sinus infection 08/13/2018  . Allergic rhinitis 08/13/2018  . Anxiety 01/05/2017  . Acute pharyngitis 05/10/2016  . Routine general medical examination at a health care facility 03/15/2016    CBeaulah Dinning PT, DPT 06/17/19 8:43 AM  [facility] [address]Phone: [phone]  Fax:  [phone] Name: NRossi BurdoMRN: [id number] of Birth: [birth year] PHYSICAL THERAPY DISCHARGE SUMMARY  Visits from Start of Care: 18  Current functional level related to goals / functional outcomes: See above   Remaining deficits: See above   Education / Equipment: Anatomy of condition, POC, HEP, exercise form/rationale  Plan: Patient agrees to discharge.   Patient goals were partially met. Patient is being discharged due to                                                     ?????   Moving forward with MRI & injections.   Jessica C. Hightower PT, DPT 07/28/19 2:15 PM

## 2019-07-03 ENCOUNTER — Telehealth: Payer: Self-pay

## 2019-07-03 NOTE — Telephone Encounter (Signed)
New message   Second Covid vaccine on 4.7.21   C/o today noticeable rash is this anything he should be concerned about or this normal.    Please advise

## 2019-07-20 ENCOUNTER — Other Ambulatory Visit: Payer: Self-pay

## 2019-07-20 ENCOUNTER — Ambulatory Visit (INDEPENDENT_AMBULATORY_CARE_PROVIDER_SITE_OTHER): Payer: BC Managed Care – PPO | Admitting: Family Medicine

## 2019-07-20 ENCOUNTER — Encounter: Payer: Self-pay | Admitting: Family Medicine

## 2019-07-20 VITALS — BP 112/68 | HR 69 | Ht 71.0 in | Wt 192.2 lb

## 2019-07-20 DIAGNOSIS — M7918 Myalgia, other site: Secondary | ICD-10-CM | POA: Diagnosis not present

## 2019-07-20 DIAGNOSIS — M79604 Pain in right leg: Secondary | ICD-10-CM

## 2019-07-20 MED ORDER — GABAPENTIN 300 MG PO CAPS: 300.0000 mg | ORAL_CAPSULE | Freq: Three times a day (TID) | ORAL | 3 refills | Status: DC | PRN

## 2019-07-20 NOTE — Progress Notes (Signed)
I, Christoper Fabian, LAT, ATC, am serving as scribe for Dr. [name].  Ricardo Knight is a 29 y.o. male who presents to [facility] at [facility] today for f/u R glute and hip pain.  He was last seen on 04/17/19.  He has completed 18 PT sessions and notes general improvement but con't to have pain in his R glute and hip.  He notes that his radiating pain into his R calf has resolved.  He states that his overall pain has decreased but con't to have pain in his R hip and glute.  He has been wearing more supportive shoes/sneakers to help w/ shock absorption.  He does note that he has pain radiating into the his posterior calf occasionally.  Diagnostic testing: L-spine and R hip XR- 04/02/19; R hip MRI- 04/13/19  Pertinent review of systems: No fevers or chills  Relevant historical information: Relatively healthy   Exam:  BP 112/68 (BP Location: Right Arm, Patient Position: Sitting, Cuff Size: Normal)   Pulse 69   Ht 5\' 11"  (1.803 m)   Wt 192 lb 3.2 oz (87.2 kg)   SpO2 97%   BMI 26.81 kg/m  General: Well Developed, well nourished, and in no acute distress.   MSK: L-spine: Nontender midline. Normal. Normal lumbar motion however somewhat limited flexion. Positive right-sided slump test mildly. Positive right-sided straight leg raise test mild. Reflexes equal bilateral lower extremities and sensation is intact throughout. Normal strength except noted below  Right hip normal-appearing Normal motion. Completely nontender. Hip abduction strength slightly diminished 4/5 without pain. Hip external rotation strength intact 5/5      Lab and Radiology Results X-ray images L-spine obtained January 2021 do show some mild neuroforaminal stenosis at L5-S1 per my interpretation.  EXAM: MRI OF THE RIGHT HIP WITHOUT AND WITH CONTRAST  TECHNIQUE: Multiplanar, multisequence MR imaging was performed both before and after administration of intravenous contrast.  CONTRAST:   10 mL GADAVIST IV  COMPARISON:  Plain films left hip is 04/02/2019.  FINDINGS: Bones: There is a well-defined T1 and T2 hypointense lesion in the intertrochanteric right femur measuring 1.4 cm craniocaudal by 1.2 cm AP by 1.3 cm transverse. The lesion does not enhance. There is no surrounding edema. No periosteal reaction, soft tissue mass or associated fracture is present. Bone marrow signal is otherwise normal.  Articular cartilage and labrum  Articular cartilage:  Normal.  Labrum:  Intact.  Joint or bursal effusion  Joint effusion:  None.  Bursae: Normal.  Muscles and tendons  Muscles and tendons:  Normal.  Other findings  Miscellaneous:   None.  Imaged intrapelvic contents are normal.  IMPRESSION: Small lesion in the right intertrochanteric is benign and has an appearance most consistent with a bone island. No follow-up imaging is recommended.   Electronically Signed   By: 05/31/2019 M.D.   On: 04/13/2019 13:31 I, 04/15/2019, personally (independently) visualized and performed the interpretation of the images attached in this note.     Assessment and Plan: 29 y.o. male with right buttocks and leg pain.  Pain likely is multifactorial.  He had considerable improvement in physical therapy but still has some limitations in hip abduction strength and hip flexion range of motion.  However this does not explain pain occasionally going beyond his knee to his posterior lateral calf.  I think his residual pain is due to lumbar radiculopathy.  On x-ray L-spine from January 2021 he does have in my interpretation some mild neuroforaminal stenosis  at L5-S1 I think it is possible that his residual symptoms are coming from this area affecting right S1 nerve root.  We had a lengthy discussion about next steps.  His symptoms are still problematic and bothering him and interfering with his quality of life.  Worthwhile chasing after with MRI L-spine.  Limited  gabapentin for symptom control especially at bedtime.  Also discussed epidural steroid injection which is likely the next treatment.  Okay to proceed directly to epidural steroid injection if MRI supports it.    Orders Placed This Encounter  Procedures  . MR Lumbar Spine Wo Contrast    Standing Status:   Future    Standing Expiration Date:   09/18/2020    Order Specific Question:   ** REASON FOR EXAM (FREE TEXT)    Answer:   eval possible right L5 or S1 radiculopathy    Order Specific Question:   What is the patient's sedation requirement?    Answer:   No Sedation    Order Specific Question:   Does the patient have a pacemaker or implanted devices?    Answer:   No    Order Specific Question:   Preferred imaging location?    Answer:   [facility] (table limit-550lbs)    Order Specific Question:   Radiology Contrast Protocol - do NOT remove file path    Answer:   [email or web address]   Meds ordered this encounter  Medications  . gabapentin (NEURONTIN) 300 MG capsule    Sig: Take 1 capsule (300 mg total) by mouth 3 (three) times daily as needed (nerve pain).    Dispense:  90 capsule    Refill:  3     Discussed warning signs or symptoms. Please see discharge instructions. Patient expresses understanding.   The above documentation has been reviewed and is accurate and complete Lynne Leader     Total encounter time 30 minutes including charting time date of service. Discussion of next steps in treatment plan and options.

## 2019-07-20 NOTE — Patient Instructions (Signed)
Thank you for coming in today.  Plan for MRI.  Likely will proceed to Epidural Steroid Injection after.  Try gabapentin for nerve pain.  Take mostly at bedtime but can be take up to 3x daily as needed.    Epidural Steroid Injection Patient Information  Description: The epidural space surrounds the nerves as they exit the spinal cord.  In some patients, the nerves can be compressed and inflamed by a bulging disc or a tight spinal canal (spinal stenosis).  By injecting steroids into the epidural space, we can bring irritated nerves into direct contact with a potentially helpful medication.  These steroids act directly on the irritated nerves and can reduce swelling and inflammation which often leads to decreased pain.  Epidural steroids may be injected anywhere along the spine and from the neck to the low back depending upon the location of your pain.   After numbing the skin with local anesthetic (like Novocaine), a small needle is passed into the epidural space slowly.  You may experience a sensation of pressure while this is being done.  The entire block usually last less than 10 minutes.  Conditions which may be treated by epidural steroids:   Low back and leg pain  Neck and arm pain  Spinal stenosis  Post-laminectomy syndrome  Herpes zoster (shingles) pain  Pain from compression fractures  Preparation for the injection:  1. Do not eat any solid food or dairy products within 8 hours of your appointment.  2. You may drink clear liquids up to 3 hours before appointment.  Clear liquids include water, black coffee, juice or soda.  No milk or cream please. 3. You may take your regular medication, including pain medications, with a sip of water before your appointment  Diabetics should hold regular insulin (if taken separately) and take 1/2 normal NPH dos the morning of the procedure.  Carry some sugar containing items with you to your appointment. 4. A driver must accompany you and be  prepared to drive you home after your procedure.  5. Bring all your current medications with your. 6. An IV may be inserted and sedation may be given at the discretion of the physician.   7. A blood pressure cuff, EKG and other monitors will often be applied during the procedure.  Some patients may need to have extra oxygen administered for a short period. 8. You will be asked to provide medical information, including your allergies, prior to the procedure.  We must know immediately if you are taking blood thinners (like Coumadin/Warfarin)  Or if you are allergic to IV iodine contrast (dye). We must know if you could possible be pregnant.  Possible side-effects:  Bleeding from needle site  Infection (rare, may require surgery)  Nerve injury (rare)  Numbness & tingling (temporary)  Difficulty urinating (rare, temporary)  Spinal headache ( a headache worse with upright posture)  Light -headedness (temporary)  Pain at injection site (several days)  Decreased blood pressure (temporary)  Weakness in arm/leg (temporary)  Pressure sensation in back/neck (temporary)  Call if you experience:  Fever/chills associated with headache or increased back/neck pain.  Headache worsened by an upright position.  New onset weakness or numbness of an extremity below the injection site  Hives or difficulty breathing (go to the emergency room)  Inflammation or drainage at the infection site  Severe back/neck pain  Any new symptoms which are concerning to you  Please note:  Although the local anesthetic injected can often make your back  or neck feel good for several hours after the injection, the pain will likely return.  It takes 3-7 days for steroids to work in the epidural space.  You may not notice any pain relief for at least that one week.  If effective, we will often do a series of three injections spaced 3-6 weeks apart to maximally decrease your pain.  After the initial series, we  generally will wait several months before considering a repeat injection of the same type.

## 2019-07-24 ENCOUNTER — Encounter: Payer: Self-pay | Admitting: Family Medicine

## 2019-07-29 NOTE — Therapy (Signed)
[place], [place], [address] Phone: [phone]   Fax:  [phone]  Physical Therapy Treatment/Discharge  Patient Details  Name: Ricardo Knight MRN: [id number] Date of Birth: [birth year] Referring Provider (PT): Gregor Hams, MD   Encounter Date: 06/17/2019    Past Medical History:  Diagnosis Date  . Anxiety   . Depression   . Exercise-induced asthma   . Heart palpitations   . History of chicken pox     Past Surgical History:  Procedure Laterality Date  . WISDOM TOOTH EXTRACTION      There were no vitals filed for this visit.                                 PT Long Term Goals - 06/17/19 0817      PT LONG TERM GOAL #1   Title  HS flexibility to at least 60 deg    Baseline  50 deg    Time  6    Period  Weeks    Status  On-going    Target Date  07/29/19      PT LONG TERM GOAL #2   Title  Pt will be able to sit comfortably on a couch for at least 20 min    Baseline  I can re-adjust better    Time  6    Period  Weeks    Status  Achieved      PT LONG TERM GOAL #3   Title  able to ambulate without limping    Baseline  intermittently still limping but improving from baseline    Time  6    Period  Weeks    Status  On-going    Target Date  07/29/19      PT LONG TERM GOAL #4   Title  able to don socks/shoes without limitation    Baseline  met/able    Time  6    Period  Weeks    Status  Achieved      PT LONG TERM GOAL #5   Title  FOTO to 24% limited    Baseline  38% limited    Time  6    Period  Weeks    Status  On-going    Target Date  07/29/19      PT LONG TERM GOAL #6   Title  able to return to walking for exercising    Baseline  able/met but still some limitation pending pain with prolonged walking    Time  6    Period  Weeks    Status  Achieved      PT LONG TERM GOAL #7   Title  return to gym lifting weights    Baseline  met    Time  6     Period  Weeks    Status  Achieved              Patient will benefit from skilled therapeutic intervention in order to improve the following deficits and impairments:  Decreased range of motion, Difficulty walking, Increased muscle spasms, Decreased activity tolerance, Pain, Improper body mechanics, Impaired flexibility, Postural dysfunction  Visit Diagnosis: Pain in right hip - Plan: PT plan of care cert/re-cert  Difficulty in walking, not elsewhere classified - Plan: PT plan of care cert/re-cert  Abnormal posture - Plan: PT plan of care cert/re-cert     Problem  List Patient Active Problem List   Diagnosis Date Noted  . 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 Acute sinus infection 08/13/2018  . Allergic rhinitis 08/13/2018  . Anxiety 01/05/2017  . Acute pharyngitis 05/10/2016  . Routine general medical examination at a health care facility 03/15/2016        PHYSICAL THERAPY DISCHARGE SUMMARY  Visits from Start of Care: 18  Current functional level related to goals / functional outcomes: Patient continuing with HEP and following up with MD for further assessment and treatment options-he had improvement with PT but continued hip/RLE symptoms   Remaining deficits: Right hip/right LE pain   Education / Equipment: HEP, POC Plan: Patient agrees to discharge.  Patient goals were partially met. Patient is being discharged due to meeting the stated rehab goals.  ?????           Beaulah Dinning, PT, DPT 07/29/19 11:37 AM     [facility] [address] [place], [place], [address] Phone: [phone]   Fax:  [phone]  Name: Ricardo Knight MRN: [id number] Date of Birth: [birth year]

## 2019-08-06 ENCOUNTER — Ambulatory Visit
Admission: RE | Admit: 2019-08-06 | Discharge: 2019-08-06 | Disposition: A | Discharge status: Home / Self Care | Payer: BC Managed Care – PPO | Source: Ambulatory Visit | Attending: Family Medicine | Admitting: Family Medicine

## 2019-08-06 ENCOUNTER — Other Ambulatory Visit: Payer: Self-pay

## 2019-08-06 DIAGNOSIS — D2239 Melanocytic nevi of other parts of face: Secondary | ICD-10-CM | POA: Diagnosis not present

## 2019-08-06 DIAGNOSIS — M7918 Myalgia, other site: Secondary | ICD-10-CM

## 2019-08-06 DIAGNOSIS — L649 Androgenic alopecia, unspecified: Secondary | ICD-10-CM | POA: Diagnosis not present

## 2019-08-06 DIAGNOSIS — M48061 Spinal stenosis, lumbar region without neurogenic claudication: Secondary | ICD-10-CM | POA: Diagnosis not present

## 2019-08-06 DIAGNOSIS — M79604 Pain in right leg: Secondary | ICD-10-CM

## 2019-08-07 ENCOUNTER — Encounter: Payer: Self-pay | Admitting: Family Medicine

## 2019-08-07 DIAGNOSIS — M79604 Pain in right leg: Secondary | ICD-10-CM

## 2019-08-07 DIAGNOSIS — M7918 Myalgia, other site: Secondary | ICD-10-CM

## 2019-08-07 DIAGNOSIS — M5431 Sciatica, right side: Secondary | ICD-10-CM

## 2019-08-07 NOTE — Progress Notes (Signed)
Lumbar spine MRI is helpful.  It does show a bulging disc compressing right L5 nerve root and probably the S1 nerve root as well.  This should be treated with epidural steroid injection.  Would you like me to go ahead and order that now?  Happy to schedule talk to review results in the near future as well.

## 2019-08-18 ENCOUNTER — Ambulatory Visit
Admission: RE | Admit: 2019-08-18 | Discharge: 2019-08-18 | Disposition: A | Discharge status: Home / Self Care | Payer: BC Managed Care – PPO | Source: Ambulatory Visit | Attending: Family Medicine | Admitting: Family Medicine

## 2019-08-18 ENCOUNTER — Other Ambulatory Visit: Payer: Self-pay

## 2019-08-18 DIAGNOSIS — M7918 Myalgia, other site: Secondary | ICD-10-CM

## 2019-08-18 DIAGNOSIS — M5431 Sciatica, right side: Secondary | ICD-10-CM

## 2019-08-18 DIAGNOSIS — M79604 Pain in right leg: Secondary | ICD-10-CM

## 2019-08-18 DIAGNOSIS — M5127 Other intervertebral disc displacement, lumbosacral region: Secondary | ICD-10-CM | POA: Diagnosis not present

## 2019-08-18 MED ORDER — METHYLPREDNISOLONE ACETATE 40 MG/ML INJ SUSP (RADIOLOG
120.0000 mg | Freq: Once | INTRAMUSCULAR | Status: AC
  Administered 2019-08-18: 120 mg via EPIDURAL

## 2019-08-18 MED ORDER — IOPAMIDOL (ISOVUE-M 200) INJECTION 41%
1.0000 mL | Freq: Once | INTRAMUSCULAR | Status: AC
  Administered 2019-08-18: 1 mL via EPIDURAL

## 2019-08-18 NOTE — Discharge Instructions (Signed)

## 2019-09-07 ENCOUNTER — Encounter: Payer: Self-pay | Admitting: Family Medicine

## 2019-09-07 DIAGNOSIS — M5431 Sciatica, right side: Secondary | ICD-10-CM

## 2019-09-14 ENCOUNTER — Ambulatory Visit
Admission: RE | Admit: 2019-09-14 | Discharge: 2019-09-14 | Disposition: A | Discharge status: Home / Self Care | Payer: BC Managed Care – PPO | Source: Ambulatory Visit | Attending: Family Medicine | Admitting: Family Medicine

## 2019-09-14 ENCOUNTER — Other Ambulatory Visit: Payer: Self-pay

## 2019-09-14 DIAGNOSIS — M79604 Pain in right leg: Secondary | ICD-10-CM | POA: Diagnosis not present

## 2019-09-14 DIAGNOSIS — M5431 Sciatica, right side: Secondary | ICD-10-CM

## 2019-09-14 MED ORDER — METHYLPREDNISOLONE ACETATE 40 MG/ML INJ SUSP (RADIOLOG
120.0000 mg | Freq: Once | INTRAMUSCULAR | Status: AC
  Administered 2019-09-14: 120 mg via EPIDURAL

## 2019-09-14 MED ORDER — IOPAMIDOL (ISOVUE-M 200) INJECTION 41%
1.0000 mL | Freq: Once | INTRAMUSCULAR | Status: AC
  Administered 2019-09-14: 1 mL via EPIDURAL

## 2019-09-14 NOTE — Discharge Instructions (Signed)

## 2019-10-21 ENCOUNTER — Other Ambulatory Visit: Payer: BC Managed Care – PPO

## 2019-10-22 ENCOUNTER — Encounter: Payer: Self-pay | Admitting: Family Medicine

## 2019-10-22 ENCOUNTER — Other Ambulatory Visit: Payer: Self-pay | Admitting: Physical Therapy

## 2019-10-22 DIAGNOSIS — Z20828 Contact with and (suspected) exposure to other viral communicable diseases: Secondary | ICD-10-CM | POA: Diagnosis not present

## 2019-10-22 DIAGNOSIS — M5431 Sciatica, right side: Secondary | ICD-10-CM

## 2019-10-22 MED ORDER — GABAPENTIN 300 MG PO CAPS: 300.0000 mg | ORAL_CAPSULE | Freq: Three times a day (TID) | ORAL | 3 refills | Status: DC | PRN

## 2019-11-12 ENCOUNTER — Other Ambulatory Visit: Payer: Self-pay | Admitting: Family Medicine

## 2019-11-12 ENCOUNTER — Ambulatory Visit
Admission: RE | Admit: 2019-11-12 | Discharge: 2019-11-12 | Disposition: A | Discharge status: Home / Self Care | Payer: BC Managed Care – PPO | Source: Ambulatory Visit | Attending: Family Medicine | Admitting: Family Medicine

## 2019-11-12 ENCOUNTER — Other Ambulatory Visit: Payer: Self-pay

## 2019-11-12 DIAGNOSIS — M5126 Other intervertebral disc displacement, lumbar region: Secondary | ICD-10-CM | POA: Diagnosis not present

## 2019-11-12 DIAGNOSIS — M5431 Sciatica, right side: Secondary | ICD-10-CM

## 2019-11-12 DIAGNOSIS — M48061 Spinal stenosis, lumbar region without neurogenic claudication: Secondary | ICD-10-CM | POA: Diagnosis not present

## 2019-11-12 MED ORDER — IOPAMIDOL (ISOVUE-M 200) INJECTION 41%
1.0000 mL | Freq: Once | INTRAMUSCULAR | Status: AC
  Administered 2019-11-12: 1 mL via EPIDURAL

## 2019-11-12 MED ORDER — METHYLPREDNISOLONE ACETATE 40 MG/ML INJ SUSP (RADIOLOG
120.0000 mg | Freq: Once | INTRAMUSCULAR | Status: AC
  Administered 2019-11-12: 120 mg via EPIDURAL

## 2019-11-12 NOTE — Discharge Instructions (Signed)

## 2019-12-27 ENCOUNTER — Encounter: Payer: Self-pay | Admitting: Family Medicine

## 2019-12-27 DIAGNOSIS — M7918 Myalgia, other site: Secondary | ICD-10-CM

## 2019-12-27 DIAGNOSIS — M79604 Pain in right leg: Secondary | ICD-10-CM

## 2019-12-27 DIAGNOSIS — M5431 Sciatica, right side: Secondary | ICD-10-CM

## 2020-01-11 DIAGNOSIS — M5416 Radiculopathy, lumbar region: Secondary | ICD-10-CM | POA: Diagnosis not present

## 2020-01-13 ENCOUNTER — Encounter: Payer: Self-pay | Admitting: Family Medicine

## 2020-01-14 MED ORDER — GABAPENTIN 300 MG PO CAPS: 300.0000 mg | ORAL_CAPSULE | Freq: Three times a day (TID) | ORAL | 3 refills | Status: DC | PRN

## 2020-01-19 ENCOUNTER — Other Ambulatory Visit: Payer: Self-pay

## 2020-01-20 ENCOUNTER — Ambulatory Visit (INDEPENDENT_AMBULATORY_CARE_PROVIDER_SITE_OTHER): Payer: BC Managed Care – PPO | Admitting: Internal Medicine

## 2020-01-20 ENCOUNTER — Encounter: Payer: Self-pay | Admitting: Internal Medicine

## 2020-01-20 VITALS — BP 110/78 | HR 78 | Temp 97.8°F | Ht 71.0 in | Wt 189.0 lb

## 2020-01-20 DIAGNOSIS — Z23 Encounter for immunization: Secondary | ICD-10-CM

## 2020-01-20 DIAGNOSIS — E538 Deficiency of other specified B group vitamins: Secondary | ICD-10-CM | POA: Diagnosis not present

## 2020-01-20 DIAGNOSIS — E559 Vitamin D deficiency, unspecified: Secondary | ICD-10-CM | POA: Diagnosis not present

## 2020-01-20 DIAGNOSIS — M5431 Sciatica, right side: Secondary | ICD-10-CM

## 2020-01-20 DIAGNOSIS — R2232 Localized swelling, mass and lump, left upper limb: Secondary | ICD-10-CM | POA: Diagnosis not present

## 2020-01-20 DIAGNOSIS — Z0001 Encounter for general adult medical examination with abnormal findings: Secondary | ICD-10-CM

## 2020-01-20 DIAGNOSIS — Z202 Contact with and (suspected) exposure to infections with a predominantly sexual mode of transmission: Secondary | ICD-10-CM | POA: Diagnosis not present

## 2020-01-20 DIAGNOSIS — Z0000 Encounter for general adult medical examination without abnormal findings: Secondary | ICD-10-CM | POA: Diagnosis not present

## 2020-01-20 DIAGNOSIS — F419 Anxiety disorder, unspecified: Secondary | ICD-10-CM

## 2020-01-20 LAB — BASIC METABOLIC PANEL
BUN: 17 mg/dL (ref 6–23)
CO2: 31 mEq/L (ref 19–32)
Calcium: 10.1 mg/dL (ref 8.4–10.5)
Chloride: 101 mEq/L (ref 96–112)
Creatinine, Ser: 0.88 mg/dL (ref 0.40–1.50)
GFR: 115.78 mL/min (ref >60.00)
Glucose, Bld: 96 mg/dL (ref 70–99)
Potassium: 3.8 mEq/L (ref 3.5–5.1)
Sodium: 139 mEq/L (ref 135–145)

## 2020-01-20 LAB — CBC WITH DIFFERENTIAL/PLATELET
Basophils Absolute: 0 10*3/uL (ref 0.0–0.1)
Basophils Relative: 0.7 % (ref 0.0–3.0)
Eosinophils Absolute: 0.1 10*3/uL (ref 0.0–0.7)
Eosinophils Relative: 2.1 % (ref 0.0–5.0)
HCT: 43.1 % (ref 39.0–52.0)
Hemoglobin: 14.8 g/dL (ref 13.0–17.0)
Lymphocytes Relative: 30.4 % (ref 12.0–46.0)
Lymphs Abs: 1.6 10*3/uL (ref 0.7–4.0)
MCHC: 34.4 g/dL (ref 30.0–36.0)
MCV: 93.5 fl (ref 78.0–100.0)
Monocytes Absolute: 0.4 10*3/uL (ref 0.1–1.0)
Monocytes Relative: 6.8 % (ref 3.0–12.0)
Neutro Abs: 3.1 10*3/uL (ref 1.4–7.7)
Neutrophils Relative %: 60 % (ref 43.0–77.0)
Platelets: 198 10*3/uL (ref 150.0–400.0)
RBC: 4.61 Mil/uL (ref 4.22–5.81)
RDW: 12.8 % (ref 11.5–15.5)
WBC: 5.1 10*3/uL (ref 4.0–10.5)

## 2020-01-20 LAB — URINALYSIS, ROUTINE W REFLEX MICROSCOPIC
Bilirubin Urine: NEGATIVE
Hgb urine dipstick: NEGATIVE
Ketones, ur: NEGATIVE
Leukocytes,Ua: NEGATIVE
Nitrite: NEGATIVE
RBC / HPF: NONE SEEN (ref 0–?)
Specific Gravity, Urine: 1.01 (ref 1.000–1.030)
Total Protein, Urine: NEGATIVE
Urine Glucose: NEGATIVE
Urobilinogen, UA: 0.2 (ref 0.0–1.0)
WBC, UA: NONE SEEN (ref 0–?)
pH: 7 (ref 5.0–8.0)

## 2020-01-20 LAB — VITAMIN D 25 HYDROXY (VIT D DEFICIENCY, FRACTURES): VITD: 43.95 ng/mL (ref 30.00–100.00)

## 2020-01-20 LAB — HEPATIC FUNCTION PANEL
ALT: 19 U/L (ref 0–53)
AST: 23 U/L (ref 0–37)
Albumin: 5 g/dL (ref 3.5–5.2)
Alkaline Phosphatase: 61 U/L (ref 39–117)
Bilirubin, Direct: 0.1 mg/dL (ref 0.0–0.3)
Total Bilirubin: 0.5 mg/dL (ref 0.2–1.2)
Total Protein: 7.4 g/dL (ref 6.0–8.3)

## 2020-01-20 LAB — LIPID PANEL
Cholesterol: 192 mg/dL (ref 0–200)
HDL: 76.5 mg/dL (ref >39.00)
LDL Cholesterol: 93 mg/dL (ref 0–99)
NonHDL: 115.45
Total CHOL/HDL Ratio: 3
Triglycerides: 113 mg/dL (ref 0.0–149.0)
VLDL: 22.6 mg/dL (ref 0.0–40.0)

## 2020-01-20 LAB — VITAMIN B12: Vitamin B-12: 1099 pg/mL — ABNORMAL HIGH (ref 211–911)

## 2020-01-20 LAB — TSH: TSH: 0.79 u[IU]/mL (ref 0.35–4.50)

## 2020-01-20 NOTE — Assessment & Plan Note (Signed)
Benign over medial epicondyle, pt reassured

## 2020-01-20 NOTE — Assessment & Plan Note (Signed)
Chronic persistent, pt encouraged for counseling and consdier start celexa 20 qd

## 2020-01-20 NOTE — Patient Instructions (Signed)
You can consider taking a lower dose of gabapentin as this may cause less sedation and fatigue  Please follow up with orthopedic for the microdiscectomy  Please call if you would like to try the Celexa 20 mg per day  Please continue all other medications as before, and refills have been done if requested.  Please have the pharmacy call with any other refills you may need.  Please continue your efforts at being more active, low cholesterol diet, and weight control.  You are otherwise up to date with prevention measures today.  Please keep your appointments with your specialists as you may have planned  Please go to the LAB at the blood drawing area for the tests to be done  You will be contacted by phone if any changes need to be made immediately.  Otherwise, you will receive a letter about your results with an explanation, but please check with MyChart first.  Please remember to sign up for MyChart if you have not done so, as this will be important to you in the future with finding out test results, communicating by private email, and scheduling acute appointments online when needed.  Please make an Appointment to return for your 1 year visit, or sooner if needed

## 2020-01-20 NOTE — Assessment & Plan Note (Signed)

## 2020-01-20 NOTE — Assessment & Plan Note (Signed)
For STD labs,  to f/u any worsening symptoms or concerns

## 2020-01-20 NOTE — Assessment & Plan Note (Addendum)
D/w pt, I agree based on MRI that he would benefit from microdiscectomy  I spent 31 minutes in addition to time for CPX wellness examination in preparing to see the patient by review of recent labs, imaging and procedures, obtaining and reviewing separately obtained history, communicating with the patient and family or caregiver, ordering medications, tests or procedures, and documenting clinical information in the EHR including the differential Dx, treatment, and any further evaluation and other management of right sciatica, nodule, std exposure, anxiety

## 2020-01-20 NOTE — Progress Notes (Signed)
 Subjective:    Patient ID: Ricardo Knight, male    DOB: 10/29/1990, 29 y.o.   MRN: [id number]  HPI  Here for wellness and f/u;  Overall doing ok;  Pt denies Chest pain, worsening SOB, DOE, wheezing, orthopnea, PND, worsening LE edema, palpitations, dizziness or syncope.  Pt denies neurological change such as new headache, facial or extremity weakness.  Pt denies polydipsia, polyuria, or low sugar symptoms. Pt states overall good compliance with treatment and medications, good tolerability, and has been trying to follow appropriate diet. No fever, night sweats, wt loss, loss of appetite, or other constitutional symptoms.  Pt states good ability with ADL's, has low fall risk, home safety reviewed and adequate, no other significant changes in hearing or vision, and only occasionally active with exercise. Pt continues to have recurring LBP without change in severity, bowel or bladder change, fever, wt loss,  worsening LE pain/numbness/weakness, gait change or falls.  Did see Dr Dumonski for back pain, recommended for microdiscectomy and wondering if I though this was appropriate.  Denies worsening depressive symptoms, suicidal ideation, or panic; has ongoing anxiety, much increased recently, asks for tx, just not sure if he wants to take daily med, and has counseling avaibe at work he has not yet started.  Also asks fr STD check though Denies urinary symptoms such as dysuria, frequency, urgency, flank pain, hematuria or n/v, fever, chills. And hsano other specific symptoms.  Also with a knot under skin near the left inner elbow Past Medical History:  Diagnosis Date  . Anxiety   . Depression   . Exercise-induced asthma   . Heart palpitations   . History of chicken pox    Past Surgical History:  Procedure Laterality Date  . WISDOM TOOTH EXTRACTION      reports that he has never smoked. He has never used smokeless tobacco. He reports current alcohol use of about 7.0 - 10.0 standard drinks of alcohol  per week. He reports that he does not use drugs. family history includes Anxiety disorder in his maternal grandmother and mother; Cancer in his paternal grandfather; Depression in his mother; Healthy in his paternal grandmother; Heart attack in his maternal grandmother; Heart disease in his maternal grandfather; Hyperlipidemia in his father; Hypertension in his father and maternal grandfather. No Known Allergies ' Current Outpatient Medications on File Prior to Visit  Medication Sig Dispense Refill  . COVID-19 Specimen Collection KIT See admin instructions.    . gabapentin (NEURONTIN) 300 MG capsule Take 1 capsule (300 mg total) by mouth 3 (three) times daily as needed (nerve pain). 90 capsule 3  . ketoconazole (NIZORAL) 2 % shampoo     . loratadine (CLARITIN) 10 MG tablet Take 10 mg by mouth daily.     No current facility-administered medications on file prior to visit.   Review of Systems All otherwise neg per pt    Objective:   Physical Exam BP 110/78 (BP Location: Left Arm, Patient Position: Sitting, Cuff Size: Large)   Pulse 78   Temp 97.8 F (36.6 C) (Oral)   Ht 5' 11" (1.803 m)   Wt 189 lb (85.7 kg)   SpO2 97%   BMI 26.36 kg/m  VS noted,  Constitutional: Pt appears in NAD HENT: Head: NCAT.  Right Ear: External ear normal.  Left Ear: External ear normal.  Eyes: . Pupils are equal, round, and reactive to light. Conjunctivae and EOM are normal Nose: without d/c or deformity Neck: Neck supple. Gross normal ROM Cardiovascular: Normal   rate and regular rhythm.   Pulmonary/Chest: Effort normal and breath sounds without rales or wheezing.  Abd:  Soft, NT, ND, + BS, no organomegaly Neurological: Pt is alert. At baseline orientation, motor grossly intact Skin: Skin is warm. No rashes, other new lesions, no LE edema, left elbow medial epicondylar area with subq < 1/2 benign appearing nodule, firm, mobile, nontender, no surrounding LA Psychiatric: Pt behavior is normal without  agitation , 2+ nervous Spine nontender All otherwise neg per pt Lab Results  Component Value Date   WBC 5.1 01/20/2020   HGB 14.8 01/20/2020   HCT 43.1 01/20/2020   PLT 198.0 01/20/2020   GLUCOSE 96 01/20/2020   CHOL 192 01/20/2020   TRIG 113.0 01/20/2020   HDL 76.50 01/20/2020   LDLCALC 93 01/20/2020   ALT 19 01/20/2020   AST 23 01/20/2020   NA 139 01/20/2020   K 3.8 01/20/2020   CL 101 01/20/2020   CREATININE 0.88 01/20/2020   BUN 17 01/20/2020   CO2 31 01/20/2020   TSH 0.79 01/20/2020      Assessment & Plan:   

## 2020-01-22 LAB — HIV ANTIBODY (ROUTINE TESTING W REFLEX): HIV 1&2 Ab, 4th Generation: NONREACTIVE

## 2020-01-22 LAB — RPR: RPR Ser Ql: NONREACTIVE

## 2020-01-22 LAB — HSV 2 ANTIBODY, IGG: HSV 2 Glycoprotein G Ab, IgG: <0.9 index

## 2020-01-23 LAB — GC/CHLAMYDIA PROBE AMP
Chlamydia trachomatis, NAA: NEGATIVE
Neisseria Gonorrhoeae by PCR: NEGATIVE

## 2020-01-24 ENCOUNTER — Encounter: Payer: Self-pay | Admitting: Internal Medicine

## 2020-02-02 ENCOUNTER — Other Ambulatory Visit: Payer: Self-pay | Admitting: Orthopedic Surgery

## 2020-02-02 DIAGNOSIS — M5416 Radiculopathy, lumbar region: Secondary | ICD-10-CM

## 2020-02-05 DIAGNOSIS — M545 Low back pain, unspecified: Secondary | ICD-10-CM | POA: Diagnosis not present

## 2020-02-08 DIAGNOSIS — M5416 Radiculopathy, lumbar region: Secondary | ICD-10-CM | POA: Diagnosis not present

## 2020-02-09 DIAGNOSIS — M5416 Radiculopathy, lumbar region: Secondary | ICD-10-CM | POA: Diagnosis not present

## 2020-02-10 DIAGNOSIS — M5416 Radiculopathy, lumbar region: Secondary | ICD-10-CM | POA: Diagnosis not present

## 2020-02-25 ENCOUNTER — Encounter: Payer: Self-pay | Admitting: Family Medicine

## 2020-02-25 ENCOUNTER — Telehealth (INDEPENDENT_AMBULATORY_CARE_PROVIDER_SITE_OTHER): Payer: BC Managed Care – PPO | Admitting: Family Medicine

## 2020-02-25 VITALS — Temp 97.1°F

## 2020-02-25 DIAGNOSIS — R062 Wheezing: Secondary | ICD-10-CM

## 2020-02-25 DIAGNOSIS — Z20822 Contact with and (suspected) exposure to covid-19: Secondary | ICD-10-CM | POA: Diagnosis not present

## 2020-02-25 DIAGNOSIS — R0981 Nasal congestion: Secondary | ICD-10-CM

## 2020-02-25 DIAGNOSIS — R059 Cough, unspecified: Secondary | ICD-10-CM | POA: Diagnosis not present

## 2020-02-25 MED ORDER — ALBUTEROL SULFATE HFA 108 (90 BASE) MCG/ACT IN AERS: 2.0000 | INHALATION_SPRAY | Freq: Four times a day (QID) | RESPIRATORY_TRACT | 0 refills | Status: DC | PRN

## 2020-02-25 NOTE — Patient Instructions (Signed)
  HOME CARE TIPS:  -[facility] COVID19 testing information: [email or web address] OR [phone] Most pharmacies also offer testing and home test kits.  -I sent the medication(s) we discussed to your pharmacy: Meds ordered this encounter  Medications  . albuterol (PROAIR HFA) 108 (90 Base) MCG/ACT inhaler    Sig: Inhale 2 puffs into the lungs every 6 (six) hours as needed for wheezing or shortness of breath.    Dispense:  1 each    Refill:  0     -COVID19 outpatient treatment center: [phone] (only call if your Covid test is positive and you are interested in monoclonal antibody treatment which is available to those with risk factors within 10 days of symptom onset)  -can use tylenol or aleve if needed for fevers, aches and pains per instructions  -can use nasal saline a few times per day if nasal congestion, sometime a short course of Afrin nasal spray for 3 days can help as well  -stay hydrated, drink plenty of fluids and eat small healthy meals - avoid dairy  -can take 1000 IU Vit D3 and Vit C lozenges per instructions  -follow up with your doctor in 2-3 days unless improving and feeling better  -stay home while sick, except to seek medical care, and if you have COVID19 please stay home for a full 10 days since the onset of symptoms PLUS one day of no fever and feeling better.  It was nice to meet you today, and I really hope you are feeling better soon. I help [facility] out with telemedicine visits on Tuesdays and Thursdays and am available for visits on those days. If you have any concerns or questions following this visit please schedule a follow up visit with your Primary Care doctor or seek care at a local urgent care clinic to avoid delays in care.    Seek in person care promptly if your symptoms worsen, new concerns arise or you are not improving with treatment. Call 911 and/or seek emergency care if you symptoms are severe or life  threatening.

## 2020-02-25 NOTE — Progress Notes (Signed)
Virtual Visit via Video Note  I connected with Ricardo Knight  on 02/25/20 at  5:00 PM EST by a video enabled telemedicine application and verified that I am speaking with the correct person using two identifiers.  Location patient: home, [place] Location provider:work or home office Persons participating in the virtual visit: patient, provider  I discussed the limitations of evaluation and management by telemedicine and the availability of in person appointments. The patient expressed understanding and agreed to proceed.   HPI:  Acute telemedicine visit for congestion: -Onset: about 6-7 days ago -Symptoms include:nasal congestion, cough, some pain in chest when coughed yesterday, L ear discomfort, body aches, feels tired, stomach a little upset, occ wheeze -Denies: SOB, NVD, loss of taste or smell, chest pain except when when cough -father was sick with the same when pt was home for thanksgiving -Has tried:musinex, tylenol -Pertinent past medical history: hx of CAP in the past with flu, EIB -Pertinent medication allergies: knda -COVID-19 vaccine status: fully vaccinated for covid and had booster and   ROS: See pertinent positives and negatives per HPI.  Past Medical History:  Diagnosis Date  . Anxiety   . Depression   . Exercise-induced asthma   . Heart palpitations   . History of chicken pox     Past Surgical History:  Procedure Laterality Date  . WISDOM TOOTH EXTRACTION       Current Outpatient Medications:  .  COVID-19 Specimen Collection KIT, See admin instructions., Disp: , Rfl:  .  gabapentin (NEURONTIN) 300 MG capsule, Take 1 capsule (300 mg total) by mouth 3 (three) times daily as needed (nerve pain)., Disp: 90 capsule, Rfl: 3 .  ketoconazole (NIZORAL) 2 % shampoo, , Disp: , Rfl:  .  loratadine (CLARITIN) 10 MG tablet, Take 10 mg by mouth daily as needed. , Disp: , Rfl:  .  albuterol (PROAIR HFA) 108 (90 Base) MCG/ACT inhaler, Inhale 2 puffs into the lungs every 6 (six) hours  as needed for wheezing or shortness of breath., Disp: 1 each, Rfl: 0  EXAM:  VITALS per patient if applicable:  GENERAL: alert, oriented, appears well and in no acute distress  HEENT: atraumatic, conjunttiva clear, no obvious abnormalities on inspection of external nose and ears  NECK: normal movements of the head and neck  LUNGS: on inspection no signs of respiratory distress, breathing rate appears normal, no obvious gross SOB, gasping or wheezing  CV: no obvious cyanosis  MS: moves all visible extremities without noticeable abnormality  PSYCH/NEURO: pleasant and cooperative, no obvious depression or anxiety, speech and thought processing grossly intact  ASSESSMENT AND PLAN:  Discussed the following assessment and plan:  Nasal congestion  Cough  Wheeze  -we discussed possible serious and likely etiologies, options for evaluation and workup, limitations of telemedicine visit vs in person visit, treatment, treatment risks and precautions. Pt prefers to treat via telemedicine empirically rather than in person at this moment.  Query viral upper respiratory illness, possible mild influenza versus other.  His sick contact tested negative for Covid and he is vaccinated.  Discussed that as another possibility though.  Opted for symptomatic care with nasal saline, short course of nasal decongestant, albuterol if any further wheezing, he feels over-the-counter options are working for cough, analgesic if needed.  Demonstrated how to use inhaler properly.  Advised of options for Covid testing, treatment, potential complications and precautions.  Advised low threshold for follow-up visit or in person care if worsening, difficulty breathing, further chest pain or not improving  over the next few days. Work/School slipped offered:declined Scheduled follow up with PCP offered: Agrees to follow-up if needed. Advised to seek prompt in person care if worsening, new symptoms arise, or if is not  improving with treatment. Discussed options for inperson care if PCP office not available. Did let this patient know that I only do telemedicine on Tuesdays and Thursdays for [facility]. Advised to schedule follow up visit with PCP or [facility] if any further questions or concerns to avoid delays in care.   I discussed the assessment and treatment plan with the patient. The patient was provided an opportunity to ask questions and all were answered. The patient agreed with the plan and demonstrated an understanding of the instructions.     Lucretia Kern, DO

## 2020-03-13 ENCOUNTER — Encounter: Payer: Self-pay | Admitting: Family Medicine

## 2020-04-04 DIAGNOSIS — M5416 Radiculopathy, lumbar region: Secondary | ICD-10-CM | POA: Diagnosis not present

## 2020-04-05 DIAGNOSIS — M5416 Radiculopathy, lumbar region: Secondary | ICD-10-CM | POA: Diagnosis not present

## 2020-04-05 DIAGNOSIS — M5116 Intervertebral disc disorders with radiculopathy, lumbar region: Secondary | ICD-10-CM | POA: Diagnosis not present

## 2020-05-23 DIAGNOSIS — M5416 Radiculopathy, lumbar region: Secondary | ICD-10-CM | POA: Diagnosis not present

## 2020-05-23 DIAGNOSIS — Z9889 Other specified postprocedural states: Secondary | ICD-10-CM | POA: Diagnosis not present

## 2020-05-25 DIAGNOSIS — M5416 Radiculopathy, lumbar region: Secondary | ICD-10-CM | POA: Diagnosis not present

## 2020-05-25 DIAGNOSIS — Z9889 Other specified postprocedural states: Secondary | ICD-10-CM | POA: Diagnosis not present

## 2020-05-30 DIAGNOSIS — M5416 Radiculopathy, lumbar region: Secondary | ICD-10-CM | POA: Diagnosis not present

## 2020-05-30 DIAGNOSIS — Z9889 Other specified postprocedural states: Secondary | ICD-10-CM | POA: Diagnosis not present

## 2020-06-01 DIAGNOSIS — M5416 Radiculopathy, lumbar region: Secondary | ICD-10-CM | POA: Diagnosis not present

## 2020-06-01 DIAGNOSIS — Z9889 Other specified postprocedural states: Secondary | ICD-10-CM | POA: Diagnosis not present

## 2020-06-03 DIAGNOSIS — M5416 Radiculopathy, lumbar region: Secondary | ICD-10-CM | POA: Diagnosis not present

## 2020-06-03 DIAGNOSIS — Z9889 Other specified postprocedural states: Secondary | ICD-10-CM | POA: Diagnosis not present

## 2020-06-06 DIAGNOSIS — M5416 Radiculopathy, lumbar region: Secondary | ICD-10-CM | POA: Diagnosis not present

## 2020-06-06 DIAGNOSIS — Z9889 Other specified postprocedural states: Secondary | ICD-10-CM | POA: Diagnosis not present

## 2020-06-08 DIAGNOSIS — M5416 Radiculopathy, lumbar region: Secondary | ICD-10-CM | POA: Diagnosis not present

## 2020-06-08 DIAGNOSIS — Z9889 Other specified postprocedural states: Secondary | ICD-10-CM | POA: Diagnosis not present

## 2020-06-10 DIAGNOSIS — Z9889 Other specified postprocedural states: Secondary | ICD-10-CM | POA: Diagnosis not present

## 2020-06-10 DIAGNOSIS — M5416 Radiculopathy, lumbar region: Secondary | ICD-10-CM | POA: Diagnosis not present

## 2020-06-13 DIAGNOSIS — M5416 Radiculopathy, lumbar region: Secondary | ICD-10-CM | POA: Diagnosis not present

## 2020-06-13 DIAGNOSIS — Z9889 Other specified postprocedural states: Secondary | ICD-10-CM | POA: Diagnosis not present

## 2020-06-15 DIAGNOSIS — Z9889 Other specified postprocedural states: Secondary | ICD-10-CM | POA: Diagnosis not present

## 2020-06-15 DIAGNOSIS — M5416 Radiculopathy, lumbar region: Secondary | ICD-10-CM | POA: Diagnosis not present

## 2020-06-17 DIAGNOSIS — M5416 Radiculopathy, lumbar region: Secondary | ICD-10-CM | POA: Diagnosis not present

## 2020-06-17 DIAGNOSIS — Z9889 Other specified postprocedural states: Secondary | ICD-10-CM | POA: Diagnosis not present

## 2020-06-22 ENCOUNTER — Telehealth (INDEPENDENT_AMBULATORY_CARE_PROVIDER_SITE_OTHER): Payer: BC Managed Care – PPO | Admitting: Internal Medicine

## 2020-06-22 DIAGNOSIS — J019 Acute sinusitis, unspecified: Secondary | ICD-10-CM

## 2020-06-22 DIAGNOSIS — F419 Anxiety disorder, unspecified: Secondary | ICD-10-CM | POA: Diagnosis not present

## 2020-06-22 DIAGNOSIS — K121 Other forms of stomatitis: Secondary | ICD-10-CM | POA: Diagnosis not present

## 2020-06-22 MED ORDER — DOXYCYCLINE HYCLATE 100 MG PO TABS: 100.0000 mg | ORAL_TABLET | Freq: Two times a day (BID) | ORAL | 0 refills | Status: DC

## 2020-06-22 MED ORDER — TRIAMCINOLONE ACETONIDE 0.1 % MT PSTE: 1.0000 "application " | PASTE | Freq: Two times a day (BID) | OROMUCOSAL | 12 refills | Status: DC

## 2020-06-22 MED ORDER — PREDNISONE 10 MG PO TABS: ORAL_TABLET | ORAL | 0 refills | Status: DC

## 2020-06-22 NOTE — Progress Notes (Signed)
Patient ID: Ricardo Knight, male   DOB: 1990/11/27, 30 y.o.   MRN: [id number]  Virtual Visit via Video Note  I connected with Ricardo Knight on 06/26/20 at  4:00 PM EDT by a video enabled telemedicine application and verified that I am speaking with the correct person using two identifiers.  Location of all participants today Patient: at home Provider: at office   I discussed the limitations of evaluation and management by telemedicine and the availability of in person appointments. The patient expressed understanding and agreed to proceed.  History of Present Illness:  Here with 2-3 days acute onset fever, facial pain, pressure, headache, general weakness and malaise, and greenish d/c, with mild ST and cough, but pt denies chest pain, wheezing, increased sob or doe, orthopnea, PND, increased LE swelling, palpitations, dizziness or syncope. Also with multiple ongoing mouth ulcers in the past week as well with pain, one to post hard palate as well.  Pt denies polydipsia, polyuria, Denies new focal neuro s/s. ot tested neg with home COVID last pm. Denies worsening depressive symptoms, suicidal ideation, or panic; has ongoing anxiety, somewhat increased recently due to multiple stressors.   Past Medical History:  Diagnosis Date  . Anxiety   . Depression   . Exercise-induced asthma   . Heart palpitations   . History of chicken pox    Past Surgical History:  Procedure Laterality Date  . WISDOM TOOTH EXTRACTION      reports that he has quit smoking. He has never used smokeless tobacco. He reports current alcohol use of about 7.0 - 10.0 standard drinks of alcohol per week. He reports that he does not use drugs. family history includes Anxiety disorder in his maternal grandmother and mother; Cancer in his paternal grandfather; Depression in his mother; Healthy in his paternal grandmother; Heart attack in his maternal grandmother; Heart disease in his maternal grandfather; Hyperlipidemia in his  father; Hypertension in his father and maternal grandfather. No Known Allergies Current Outpatient Medications on File Prior to Visit  Medication Sig Dispense Refill  . albuterol (PROAIR HFA) 108 (90 Base) MCG/ACT inhaler Inhale 2 puffs into the lungs every 6 (six) hours as needed for wheezing or shortness of breath. 1 each 0  . COVID-19 Specimen Collection KIT See admin instructions.    . gabapentin (NEURONTIN) 300 MG capsule Take 1 capsule (300 mg total) by mouth 3 (three) times daily as needed (nerve pain). 90 capsule 3  . ketoconazole (NIZORAL) 2 % shampoo     . loratadine (CLARITIN) 10 MG tablet Take 10 mg by mouth daily as needed.      No current facility-administered medications on file prior to visit.    Observations/Objective: Alert, NAD, appropriate mood and affect, resps normal, cn 2-12 intact, moves all 4s, no visible rash or swelling Lab Results  Component Value Date   WBC 5.1 01/20/2020   HGB 14.8 01/20/2020   HCT 43.1 01/20/2020   PLT 198.0 01/20/2020   GLUCOSE 96 01/20/2020   CHOL 192 01/20/2020   TRIG 113.0 01/20/2020   HDL 76.50 01/20/2020   LDLCALC 93 01/20/2020   ALT 19 01/20/2020   AST 23 01/20/2020   NA 139 01/20/2020   K 3.8 01/20/2020   CL 101 01/20/2020   CREATININE 0.88 01/20/2020   BUN 17 01/20/2020   CO2 31 01/20/2020   TSH 0.79 01/20/2020   Assessment and Plan: See notes  Follow Up Instructions: See notes   I discussed the assessment and treatment plan with the  patient. The patient was provided an opportunity to ask questions and all were answered. The patient agreed with the plan and demonstrated an understanding of the instructions.   The patient was advised to call back or seek an in-person evaluation if the symptoms worsen or if the condition fails to improve as anticipated.  Cathlean Cower, MD

## 2020-06-22 NOTE — Patient Instructions (Signed)
Please take all new medication as prescribed 

## 2020-06-23 ENCOUNTER — Encounter: Payer: Self-pay | Admitting: Internal Medicine

## 2020-06-26 ENCOUNTER — Encounter: Payer: Self-pay | Admitting: Internal Medicine

## 2020-06-26 DIAGNOSIS — K121 Other forms of stomatitis: Secondary | ICD-10-CM | POA: Insufficient documentation

## 2020-06-26 NOTE — Assessment & Plan Note (Signed)
Mild to mod, for antibx course,  to f/u any worsening symptoms or concerns 

## 2020-06-26 NOTE — Assessment & Plan Note (Signed)
Chronic mild persistent, mild, declines further tx at this time

## 2020-06-26 NOTE — Assessment & Plan Note (Signed)
C/w apthous type - for predpac asd, and kenalo in orabases asd prn

## 2020-06-29 ENCOUNTER — Other Ambulatory Visit: Payer: Self-pay | Admitting: Orthopedic Surgery

## 2020-06-29 DIAGNOSIS — M5416 Radiculopathy, lumbar region: Secondary | ICD-10-CM | POA: Diagnosis not present

## 2020-06-29 DIAGNOSIS — Z9889 Other specified postprocedural states: Secondary | ICD-10-CM | POA: Diagnosis not present

## 2020-06-30 ENCOUNTER — Other Ambulatory Visit: Payer: Self-pay

## 2020-06-30 ENCOUNTER — Ambulatory Visit
Admission: RE | Admit: 2020-06-30 | Discharge: 2020-06-30 | Disposition: A | Discharge status: Home / Self Care | Payer: BC Managed Care – PPO | Source: Ambulatory Visit | Attending: Orthopedic Surgery | Admitting: Orthopedic Surgery

## 2020-06-30 DIAGNOSIS — M48061 Spinal stenosis, lumbar region without neurogenic claudication: Secondary | ICD-10-CM | POA: Diagnosis not present

## 2020-06-30 DIAGNOSIS — M545 Low back pain, unspecified: Secondary | ICD-10-CM | POA: Diagnosis not present

## 2020-06-30 DIAGNOSIS — M5416 Radiculopathy, lumbar region: Secondary | ICD-10-CM

## 2020-07-01 DIAGNOSIS — M5416 Radiculopathy, lumbar region: Secondary | ICD-10-CM | POA: Diagnosis not present

## 2020-07-01 DIAGNOSIS — Z9889 Other specified postprocedural states: Secondary | ICD-10-CM | POA: Diagnosis not present

## 2020-07-05 DIAGNOSIS — M545 Low back pain, unspecified: Secondary | ICD-10-CM | POA: Diagnosis not present

## 2020-07-13 DIAGNOSIS — Z9889 Other specified postprocedural states: Secondary | ICD-10-CM | POA: Diagnosis not present

## 2020-07-13 DIAGNOSIS — M5416 Radiculopathy, lumbar region: Secondary | ICD-10-CM | POA: Diagnosis not present

## 2020-07-15 DIAGNOSIS — M5416 Radiculopathy, lumbar region: Secondary | ICD-10-CM | POA: Diagnosis not present

## 2020-07-15 DIAGNOSIS — Z9889 Other specified postprocedural states: Secondary | ICD-10-CM | POA: Diagnosis not present

## 2020-07-18 DIAGNOSIS — M5416 Radiculopathy, lumbar region: Secondary | ICD-10-CM | POA: Diagnosis not present

## 2020-07-18 DIAGNOSIS — Z9889 Other specified postprocedural states: Secondary | ICD-10-CM | POA: Diagnosis not present

## 2020-07-20 DIAGNOSIS — M5416 Radiculopathy, lumbar region: Secondary | ICD-10-CM | POA: Diagnosis not present

## 2020-07-20 DIAGNOSIS — Z9889 Other specified postprocedural states: Secondary | ICD-10-CM | POA: Diagnosis not present

## 2020-07-22 DIAGNOSIS — Z9889 Other specified postprocedural states: Secondary | ICD-10-CM | POA: Diagnosis not present

## 2020-07-22 DIAGNOSIS — M5416 Radiculopathy, lumbar region: Secondary | ICD-10-CM | POA: Diagnosis not present

## 2020-08-02 DIAGNOSIS — Z9889 Other specified postprocedural states: Secondary | ICD-10-CM | POA: Diagnosis not present

## 2020-08-02 DIAGNOSIS — M5416 Radiculopathy, lumbar region: Secondary | ICD-10-CM | POA: Diagnosis not present

## 2020-08-03 ENCOUNTER — Other Ambulatory Visit: Payer: Self-pay

## 2020-08-03 ENCOUNTER — Encounter: Payer: Self-pay | Admitting: Internal Medicine

## 2020-08-03 ENCOUNTER — Ambulatory Visit (INDEPENDENT_AMBULATORY_CARE_PROVIDER_SITE_OTHER): Payer: BC Managed Care – PPO | Admitting: Internal Medicine

## 2020-08-03 VITALS — BP 118/70 | HR 84 | Temp 98.1°F | Ht 71.0 in | Wt 189.6 lb

## 2020-08-03 DIAGNOSIS — Z0001 Encounter for general adult medical examination with abnormal findings: Secondary | ICD-10-CM

## 2020-08-03 DIAGNOSIS — F419 Anxiety disorder, unspecified: Secondary | ICD-10-CM | POA: Diagnosis not present

## 2020-08-03 DIAGNOSIS — F32A Depression, unspecified: Secondary | ICD-10-CM

## 2020-08-03 DIAGNOSIS — R41840 Attention and concentration deficit: Secondary | ICD-10-CM

## 2020-08-03 NOTE — Patient Instructions (Signed)
You will be contacted regarding the referral for: ADHD clinic  Please call if you change your mind about celexa for mood  Please continue all other medications as before, and refills have been done if requested.  Please have the pharmacy call with any other refills you may need.  Please continue your efforts at being more active, low cholesterol diet, and weight control.  You are otherwise up to date with prevention measures today.  Please keep your appointments with your specialists as you may have planned  Please make an Appointment to return for your 1 year visit, or sooner if needed

## 2020-08-03 NOTE — Progress Notes (Signed)
Patient ID: Ricardo Knight, male   DOB: 09-11-1990, 30 y.o.   MRN: [id number]         Chief Complaint:: wellness exam and office visit (Discuss possible ADHD)  and anxiety/depression       HPI:  Ricardo Knight is a 30 y.o. male here for wellness exam; declines hep c screen and covid booster, o/w up to date with preventive referrals and immunizations                        Also here with worsening emotional symptoms - Has had mild to mod worsening depressive symptoms, but no suicidal ideation, or panic; has ongoing anxiety.  Has worsening concentration ability and task completion recently even at work, for over 6 mo, mild tomod..  Pt denies chest pain, increased sob or doe, wheezing, orthopnea, PND, increased LE swelling, palpitations, dizziness or syncope.   Pt denies polydipsia, polyuria, or new focal neuro s/s.   Pt denies fever, wt loss, night sweats, loss of appetite, or other constitutional symptoms  No other new complaints  Wt Readings from Last 3 Encounters:  08/03/20 189 lb 9.6 oz (86 kg)  01/20/20 189 lb (85.7 kg)  07/20/19 192 lb 3.2 oz (87.2 kg)   BP Readings from Last 3 Encounters:  08/03/20 118/70  01/20/20 110/78  11/12/19 (!) 122/46   Immunization History  Administered Date(s) Administered  . Influenza,inj,Quad PF,6+ Mos 03/15/2016, 02/20/2017, 02/26/2018, 01/29/2019, 01/20/2020  . PFIZER(Purple Top)SARS-COV-2 Vaccination 05/27/2019, 07/01/2019  . Tdap 02/26/2018   There are no preventive care reminders to display for this patient.    Past Medical History:  Diagnosis Date  . Anxiety   . Depression   . Exercise-induced asthma   . Heart palpitations   . History of chicken pox    Past Surgical History:  Procedure Laterality Date  . WISDOM TOOTH EXTRACTION      reports that he has quit smoking. He has never used smokeless tobacco. He reports current alcohol use of about 7.0 - 10.0 standard drinks of alcohol per week. He reports that he does not use drugs. family  history includes Anxiety disorder in his maternal grandmother and mother; Cancer in his paternal grandfather; Depression in his mother; Healthy in his paternal grandmother; Heart attack in his maternal grandmother; Heart disease in his maternal grandfather; Hyperlipidemia in his father; Hypertension in his father and maternal grandfather. No Known Allergies Current Outpatient Medications on File Prior to Visit  Medication Sig Dispense Refill  . albuterol (PROAIR HFA) 108 (90 Base) MCG/ACT inhaler Inhale 2 puffs into the lungs every 6 (six) hours as needed for wheezing or shortness of breath. 1 each 0  . gabapentin (NEURONTIN) 300 MG capsule Take 1 capsule (300 mg total) by mouth 3 (three) times daily as needed (nerve pain). 90 capsule 3  . ketoconazole (NIZORAL) 2 % shampoo     . loratadine (CLARITIN) 10 MG tablet Take 10 mg by mouth daily as needed.     . triamcinolone (KENALOG) 0.1 % paste Use as directed 1 application in the mouth or throat 2 (two) times daily. 5 g 12   No current facility-administered medications on file prior to visit.        ROS:  All others reviewed and negative.  Objective        PE:  BP 118/70 (BP Location: Right Arm, Patient Position: Sitting, Cuff Size: Large)   Pulse 84   Temp 98.1 F (36.7 C) (Oral)  Ht 5\' 11"  (1.803 m)   Wt 189 lb 9.6 oz (86 kg)   SpO2 97%   BMI 26.44 kg/m                 Constitutional: Pt appears in NAD               HENT: Head: NCAT.                Right Ear: External ear normal.                 Left Ear: External ear normal.                Eyes: . Pupils are equal, round, and reactive to light. Conjunctivae and EOM are normal               Nose: without d/c or deformity               Neck: Neck supple. Gross normal ROM               Cardiovascular: Normal rate and regular rhythm.                 Pulmonary/Chest: Effort normal and breath sounds without rales or wheezing.                Abd:  Soft, NT, ND, + BS, no organomegaly                Neurological: Pt is alert. At baseline orientation, motor grossly intact               Skin: Skin is warm. No rashes, no other new lesions, LE edema - none               Psychiatric: Pt behavior is normal without agitation , nervous  Micro: none  Cardiac tracings I have personally interpreted today:  none  Pertinent Radiological findings (summarize): none   Lab Results  Component Value Date   WBC 5.1 01/20/2020   HGB 14.8 01/20/2020   HCT 43.1 01/20/2020   PLT 198.0 01/20/2020   GLUCOSE 96 01/20/2020   CHOL 192 01/20/2020   TRIG 113.0 01/20/2020   HDL 76.50 01/20/2020   LDLCALC 93 01/20/2020   ALT 19 01/20/2020   AST 23 01/20/2020   NA 139 01/20/2020   K 3.8 01/20/2020   CL 101 01/20/2020   CREATININE 0.88 01/20/2020   BUN 17 01/20/2020   CO2 31 01/20/2020   TSH 0.79 01/20/2020   Assessment/Plan:  Ricardo Knight is a 30 y.o. White or Caucasian [1] male with  has a past medical history of Anxiety, Depression, Exercise-induced asthma, Heart palpitations, and History of chicken pox.  Encounter for well adult exam with abnormal findings Age and sex appropriate education and counseling updated with regular exercise and diet Referrals for preventative services - declines hep c screen or labs today Immunizations addressed - declines covid booster Smoking counseling  - none needed Evidence for depression or other mood disorder - none significant Most recent labs reviewed. I have personally reviewed and have noted: 1) the patient's medical and social history 2) The patient's current medications and supplements 3) The patient's height, weight, and BMI have been recorded in the chart   Lack of concentration ? ADD - for referral ADD testing,  to f/u any worsening symptoms or concerns  Depression D/w pt to start celexa, but declines for now  Anxiety D/w pt - declines further  other tx or referral at this time  Followup: Return in about 1 year (around  08/03/2021).  Oliver Barre, MD 08/10/2020 9:26 PM [facility]

## 2020-08-09 DIAGNOSIS — M5416 Radiculopathy, lumbar region: Secondary | ICD-10-CM | POA: Diagnosis not present

## 2020-08-09 DIAGNOSIS — Z9889 Other specified postprocedural states: Secondary | ICD-10-CM | POA: Diagnosis not present

## 2020-08-10 ENCOUNTER — Encounter: Payer: Self-pay | Admitting: Internal Medicine

## 2020-08-10 NOTE — Assessment & Plan Note (Signed)
?   ADD - for referral ADD testing,  to f/u any worsening symptoms or concerns

## 2020-08-10 NOTE — Assessment & Plan Note (Signed)
D/w pt to start celexa, but declines for now

## 2020-08-10 NOTE — Assessment & Plan Note (Signed)
Age and sex appropriate education and counseling updated with regular exercise and diet Referrals for preventative services - declines hep c screen or labs today Immunizations addressed - declines covid booster Smoking counseling  - none needed Evidence for depression or other mood disorder - none significant Most recent labs reviewed. I have personally reviewed and have noted: 1) the patient's medical and social history 2) The patient's current medications and supplements 3) The patient's height, weight, and BMI have been recorded in the chart

## 2020-08-10 NOTE — Assessment & Plan Note (Signed)
D/w pt - declines further other tx or referral at this time

## 2020-08-17 DIAGNOSIS — Z9889 Other specified postprocedural states: Secondary | ICD-10-CM | POA: Diagnosis not present

## 2020-08-17 DIAGNOSIS — M5416 Radiculopathy, lumbar region: Secondary | ICD-10-CM | POA: Diagnosis not present

## 2020-08-24 DIAGNOSIS — Z9889 Other specified postprocedural states: Secondary | ICD-10-CM | POA: Diagnosis not present

## 2020-08-24 DIAGNOSIS — M5416 Radiculopathy, lumbar region: Secondary | ICD-10-CM | POA: Diagnosis not present

## 2020-09-08 ENCOUNTER — Ambulatory Visit (INDEPENDENT_AMBULATORY_CARE_PROVIDER_SITE_OTHER): Payer: BC Managed Care – PPO | Admitting: Psychology

## 2020-09-08 DIAGNOSIS — Z9889 Other specified postprocedural states: Secondary | ICD-10-CM | POA: Diagnosis not present

## 2020-09-08 DIAGNOSIS — F89 Unspecified disorder of psychological development: Secondary | ICD-10-CM | POA: Diagnosis not present

## 2020-09-08 DIAGNOSIS — M5416 Radiculopathy, lumbar region: Secondary | ICD-10-CM | POA: Diagnosis not present

## 2020-09-14 ENCOUNTER — Ambulatory Visit: Payer: BC Managed Care – PPO | Admitting: Psychology

## 2020-09-23 DIAGNOSIS — J020 Streptococcal pharyngitis: Secondary | ICD-10-CM | POA: Diagnosis not present

## 2020-10-05 ENCOUNTER — Ambulatory Visit: Payer: BC Managed Care – PPO | Admitting: Psychology

## 2020-10-18 ENCOUNTER — Other Ambulatory Visit: Payer: Self-pay

## 2020-10-18 ENCOUNTER — Ambulatory Visit (INDEPENDENT_AMBULATORY_CARE_PROVIDER_SITE_OTHER): Payer: BC Managed Care – PPO | Admitting: Internal Medicine

## 2020-10-18 ENCOUNTER — Ambulatory Visit: Payer: BC Managed Care – PPO | Admitting: Internal Medicine

## 2020-10-18 VITALS — BP 118/60 | HR 86 | Temp 98.7°F | Ht 71.0 in | Wt 187.0 lb

## 2020-10-18 DIAGNOSIS — J039 Acute tonsillitis, unspecified: Secondary | ICD-10-CM

## 2020-10-18 DIAGNOSIS — J309 Allergic rhinitis, unspecified: Secondary | ICD-10-CM

## 2020-10-18 MED ORDER — PREDNISONE 10 MG PO TABS: ORAL_TABLET | ORAL | 0 refills | Status: DC

## 2020-10-18 MED ORDER — AMOXICILLIN-POT CLAVULANATE 875-125 MG PO TABS: 1.0000 | ORAL_TABLET | Freq: Two times a day (BID) | ORAL | 0 refills | Status: DC

## 2020-10-18 NOTE — Patient Instructions (Signed)
Please recheck your covid home testing tomorrow  Please take all new medication as prescribed - the antibiotic  If not improved, please take the prednisone   You can also take OTC Nasacort for allergies as well, and even Mucinex for congestion  If none helps and you still have symptoms, please call for ENT referrral  We can also consider allergy referral if needed  Please continue all other medications as before, and refills have been done if requested.  Please have the pharmacy call with any other refills you may need.  Please keep your appointments with your specialists as you may have planned

## 2020-10-18 NOTE — Progress Notes (Signed)
Chief Complaint: follow up 6 weeks ongoing ST       HPI:  Ricardo Knight is a 30 y.o. male here with c/o ST x 6 wk, mild intermittent, sharp without radiation, no sinus symptoms or ear fullness, no high fever, chills or worsening neck pain, dysphagia, HA  or sob; nothing else makes better or worse.  No prior hx of tonsillitis.  No sick contacts.  Has been increased worse x 2-3 days now mod to severe pain, so comes in today.  Did have covid neg x 2 in the past 3 days.  Pt denies chest pain, increased sob or doe, wheezing, orthopnea, PND, increased LE swelling, palpitations, dizziness or syncope.   Pt denies polydipsia, polyuria, or new focal neuro s/s.  Denies worsening reflux, abd pain, dysphagia, n/v, bowel change or blood.  No other new complaints Wt Readings from Last 3 Encounters:  10/18/20 187 lb (84.8 kg)  08/03/20 189 lb 9.6 oz (86 kg)  01/20/20 189 lb (85.7 kg)   BP Readings from Last 3 Encounters:  10/18/20 118/60  08/03/20 118/70  01/20/20 110/78         Past Medical History:  Diagnosis Date   Anxiety    Depression    Exercise-induced asthma    Heart palpitations    History of chicken pox    Past Surgical History:  Procedure Laterality Date   WISDOM TOOTH EXTRACTION      reports that he has quit smoking. He has never used smokeless tobacco. He reports current alcohol use of about 7.0 - 10.0 standard drinks of alcohol per week. He reports that he does not use drugs. family history includes Anxiety disorder in his maternal grandmother and mother; Cancer in his paternal grandfather; Depression in his mother; Healthy in his paternal grandmother; Heart attack in his maternal grandmother; Heart disease in his maternal grandfather; Hyperlipidemia in his father; Hypertension in his father and maternal grandfather. No Known Allergies Current Outpatient Medications on File Prior to Visit  Medication Sig Dispense Refill   albuterol (PROAIR HFA) 108 (90 Base) MCG/ACT inhaler  Inhale 2 puffs into the lungs every 6 (six) hours as needed for wheezing or shortness of breath. (Patient not taking: Reported on 10/18/2020) 1 each 0   gabapentin (NEURONTIN) 300 MG capsule Take 1 capsule (300 mg total) by mouth 3 (three) times daily as needed (nerve pain). (Patient not taking: Reported on 10/18/2020) 90 capsule 3   ketoconazole (NIZORAL) 2 % shampoo  (Patient not taking: Reported on 10/18/2020)     loratadine (CLARITIN) 10 MG tablet Take 10 mg by mouth daily as needed.  (Patient not taking: Reported on 10/18/2020)     triamcinolone (KENALOG) 0.1 % paste Use as directed 1 application in the mouth or throat 2 (two) times daily. (Patient not taking: Reported on 10/18/2020) 5 g 12   No current facility-administered medications on file prior to visit.        ROS:  All others reviewed and negative.  Objective        PE:  BP 118/60   Pulse 86   Temp 98.7 F (37.1 C) (Oral)   Ht 5\' 11"  (1.803 m)   Wt 187 lb (84.8 kg)   SpO2 97%   BMI 26.08 kg/m                 Constitutional: Pt appears in NAD  HENT: Head: NCAT.                Right Ear: External ear normal.                 Left Ear: External ear normal.                Eyes: . Pupils are equal, round, and reactive to light. Conjunctivae and EOM are normal;Bilat tm's with mild erythema.  Max sinus areas non tender.  Pharynx with mild erythema, no exudate               Nose: without d/c or deformity               Pharynx with 2+ moderate tonsillary hypertrophy               Neck: Neck supple. Gross normal ROM, no LA or tenderness               Cardiovascular: Normal rate and regular rhythm.                 Pulmonary/Chest: Effort normal and breath sounds without rales or wheezing.                Abd:  Soft, NT, ND, + BS, no organomegaly               Neurological: Pt is alert. At baseline orientation, motor grossly intact               Skin: Skin is warm. No rashes, no other new lesions, LE edema - none                Psychiatric: Pt behavior is normal without agitation   Micro: none  Cardiac tracings I have personally interpreted today:  none  Pertinent Radiological findings (summarize): none   Lab Results  Component Value Date   WBC 5.1 01/20/2020   HGB 14.8 01/20/2020   HCT 43.1 01/20/2020   PLT 198.0 01/20/2020   GLUCOSE 96 01/20/2020   CHOL 192 01/20/2020   TRIG 113.0 01/20/2020   HDL 76.50 01/20/2020   LDLCALC 93 01/20/2020   ALT 19 01/20/2020   AST 23 01/20/2020   NA 139 01/20/2020   K 3.8 01/20/2020   CL 101 01/20/2020   CREATININE 0.88 01/20/2020   BUN 17 01/20/2020   CO2 31 01/20/2020   TSH 0.79 01/20/2020   Assessment/Plan:  Ricardo Knight is a 30 y.o. White or Caucasian [1] male with  has a past medical history of Anxiety, Depression, Exercise-induced asthma, Heart palpitations, and History of chicken pox.  Tonsillitis Exam appears to be consistent with acute on more chronic tonsillitis - for augmentin course, consider prednisone course if not improved, and consider ENT referral for any worsening or persistent; also to check repeat covid home testing tomorrow one more time  Allergic rhinitis Overall stable, for nasacort otc prn,  to f/u any worsening symptoms or concerns  Followup: No follow-ups on file.  Oliver Barre, MD 10/20/2020 4:41 AM [facility]

## 2020-10-19 ENCOUNTER — Ambulatory Visit (INDEPENDENT_AMBULATORY_CARE_PROVIDER_SITE_OTHER): Payer: BC Managed Care – PPO | Admitting: Psychology

## 2020-10-19 DIAGNOSIS — F330 Major depressive disorder, recurrent, mild: Secondary | ICD-10-CM | POA: Diagnosis not present

## 2020-10-20 NOTE — Assessment & Plan Note (Addendum)
Overall stable, for nasacort otc prn,  to f/u any worsening symptoms or concerns

## 2020-10-20 NOTE — Assessment & Plan Note (Addendum)
Exam appears to be consistent with acute on more chronic tonsillitis - for augmentin course, consider prednisone course if not improved, and consider ENT referral for any worsening or persistent; also to check repeat covid home testing tomorrow one more time

## 2020-10-21 ENCOUNTER — Encounter: Payer: Self-pay | Admitting: Internal Medicine

## 2020-10-21 NOTE — Telephone Encounter (Signed)
Patient calling seeking advice  Please respond in MyChart

## 2020-11-21 ENCOUNTER — Encounter: Payer: Self-pay | Admitting: Internal Medicine

## 2020-11-21 DIAGNOSIS — Z202 Contact with and (suspected) exposure to infections with a predominantly sexual mode of transmission: Secondary | ICD-10-CM

## 2020-11-23 ENCOUNTER — Other Ambulatory Visit: Payer: BC Managed Care – PPO

## 2020-11-23 DIAGNOSIS — Z202 Contact with and (suspected) exposure to infections with a predominantly sexual mode of transmission: Secondary | ICD-10-CM

## 2020-11-23 NOTE — Addendum Note (Signed)
Addended by: Clarrisa Kaylor E on: 11/23/2020 08:31 AM   Modules accepted: Orders  

## 2020-11-23 NOTE — Addendum Note (Signed)
Addended by: Quisha Mabie E on: 11/23/2020 08:31 AM   Modules accepted: Orders  

## 2020-11-23 NOTE — Addendum Note (Signed)
Addended by: Elita Boone E on: 11/23/2020 08:31 AM   Modules accepted: Orders

## 2020-11-24 ENCOUNTER — Encounter: Payer: Self-pay | Admitting: Internal Medicine

## 2020-11-24 LAB — HSV 2 ANTIBODY, IGG: HSV 2 Glycoprotein G Ab, IgG: <0.9 index

## 2020-11-24 LAB — RPR: RPR Ser Ql: NONREACTIVE

## 2020-11-24 LAB — HIV ANTIBODY (ROUTINE TESTING W REFLEX): HIV 1&2 Ab, 4th Generation: NONREACTIVE

## 2020-11-26 ENCOUNTER — Encounter: Payer: Self-pay | Admitting: Internal Medicine

## 2020-11-26 LAB — GC/CHLAMYDIA PROBE AMP
Chlamydia trachomatis, NAA: NEGATIVE
Neisseria Gonorrhoeae by PCR: NEGATIVE

## 2020-12-30 ENCOUNTER — Telehealth: Payer: Self-pay | Admitting: Internal Medicine

## 2020-12-30 DIAGNOSIS — Z0000 Encounter for general adult medical examination without abnormal findings: Secondary | ICD-10-CM

## 2020-12-30 NOTE — Telephone Encounter (Signed)
Ok labs ordered 

## 2020-12-30 NOTE — Telephone Encounter (Signed)
Requesting labs for his physical on 01/20/2021.   Please advise.

## 2021-01-03 NOTE — Telephone Encounter (Signed)
Patient notified

## 2021-01-05 IMAGING — MR MR LUMBAR SPINE W/O CM
4 of 5 series · 18 of 48 positions shown · non-contrast
Comparison: Radiography 04/02/2019

CLINICAL DATA: Right L5 or S1 radiculopathy. Right leg pain
radiating to the calf. Leg weakness.

EXAM:
MRI LUMBAR SPINE WITHOUT CONTRAST
TECHNIQUE: Multiplanar, multisequence MR imaging of the lumbar spine was
performed. No intravenous contrast was administered.

[Series 5: T2 · sagittal · 4.0mm · 0.73mm/px · 6 of 15 slices shown (1 of 2)]
[im 1/15]
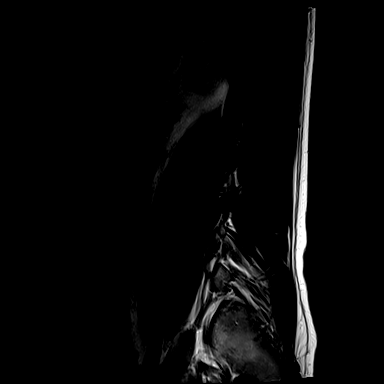
[im 3/15]
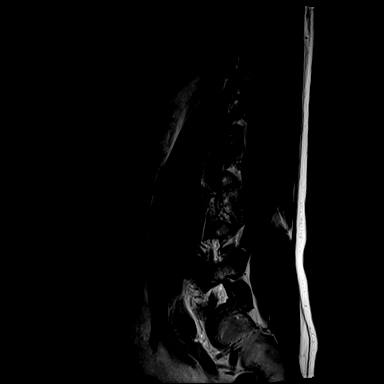
[im 6/15]
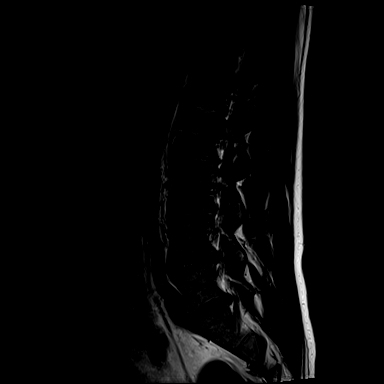
[im 9/15]
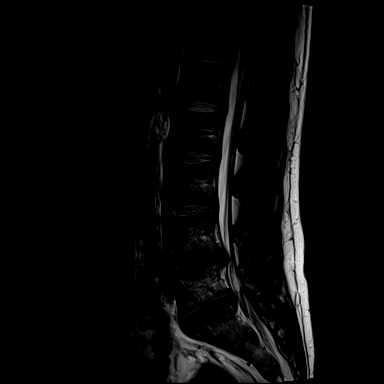
[im 12/15]
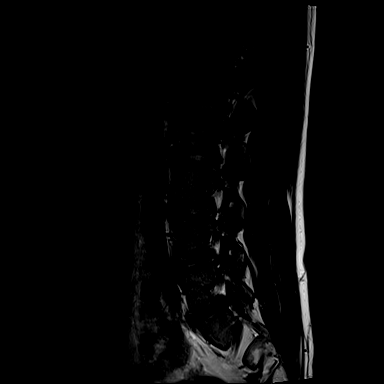
[im 15/15]
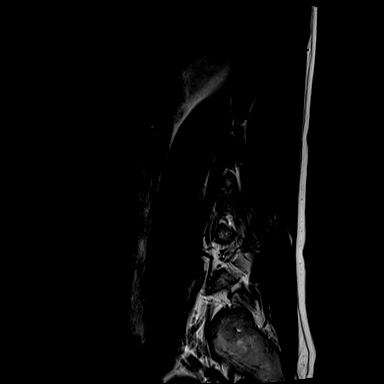

[Series 6: T1 · sagittal · 4.0mm · 0.73mm/px · 3 of 15 slices shown (1 of 2)]
[im 3/15]
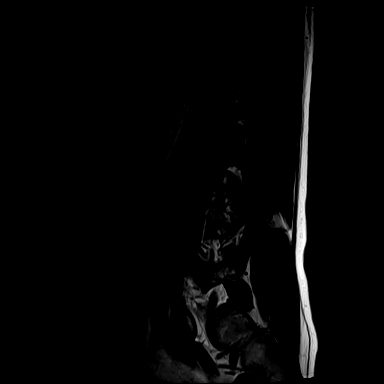
[im 9/15]
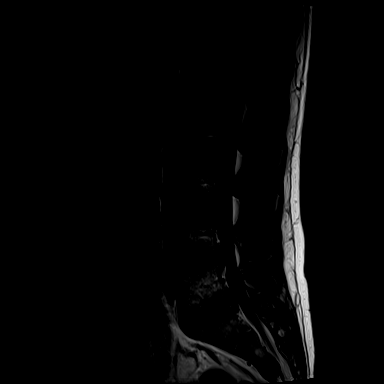
[im 15/15]
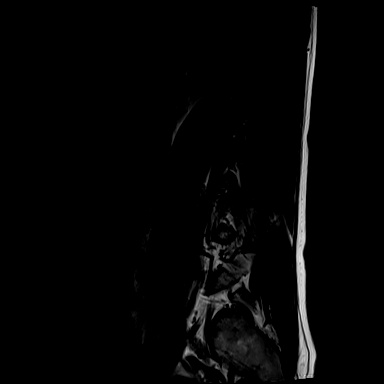

[Series 10: T1 · axial · 4.0mm · 0.28mm/px · z∈[-80,+84]mm · 3 of 40 slices shown (2 of 2)]
[im 6/40]
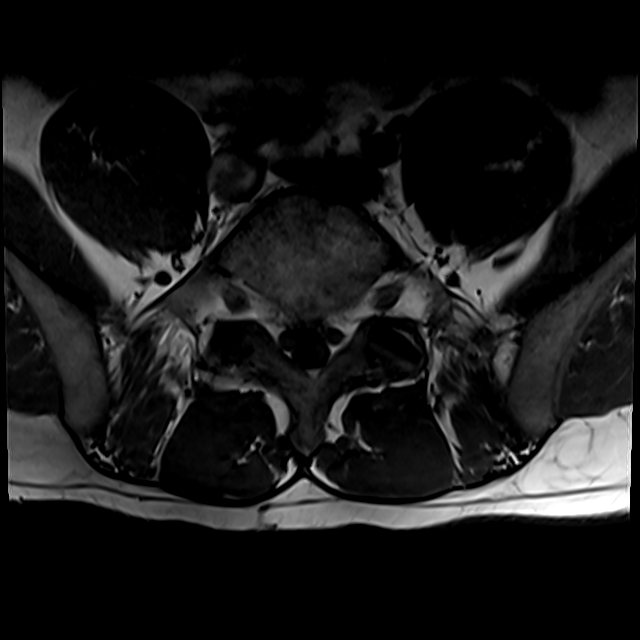
[im 20/40]
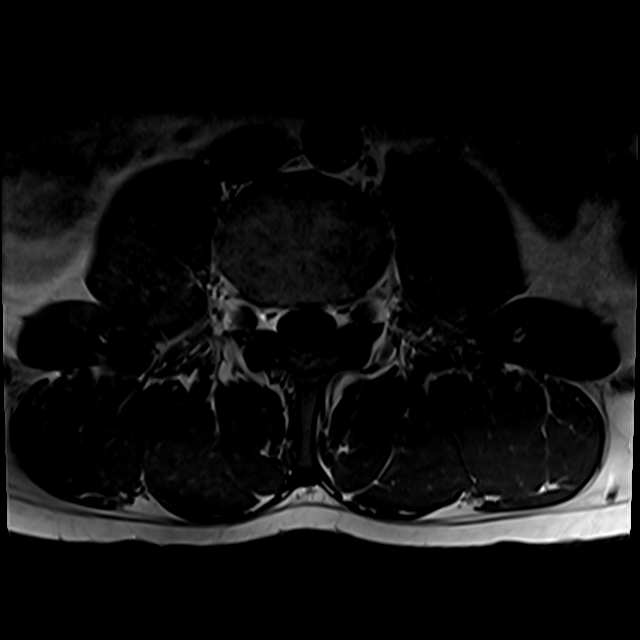
[im 34/40]
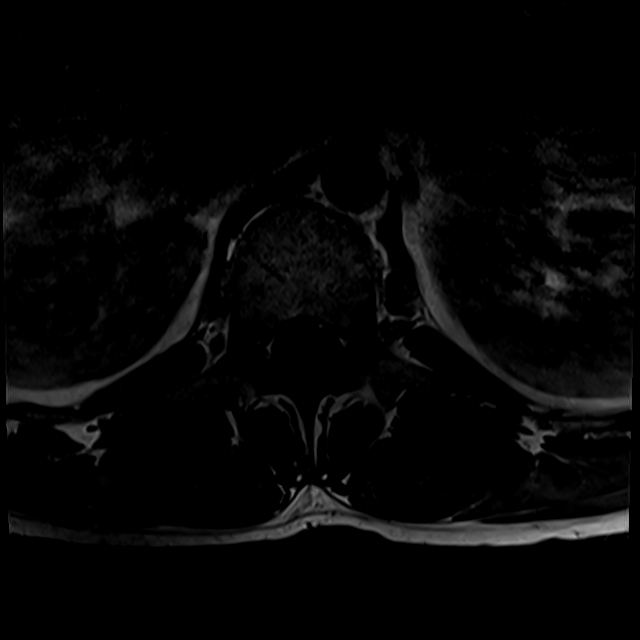

[Series 13: T2 · axial · 4.0mm · 0.28mm/px · z∈[-105,+84]mm · 6 of 40 slices shown (2 of 2)]
[im 1/40]
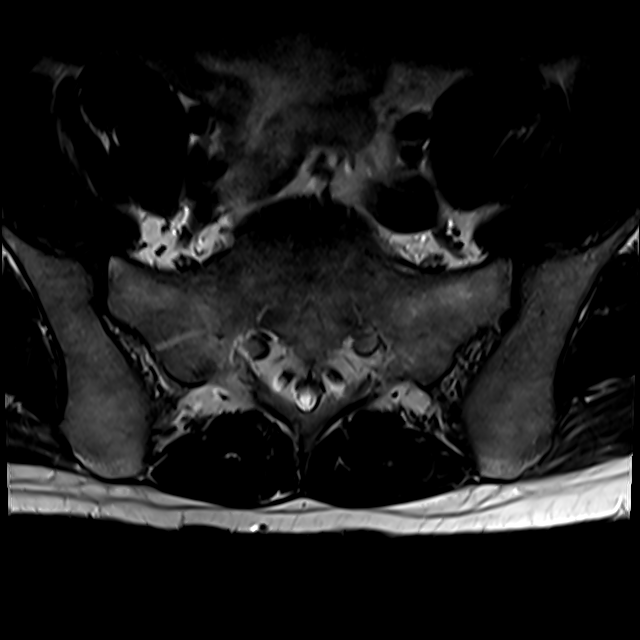
[im 6/40]
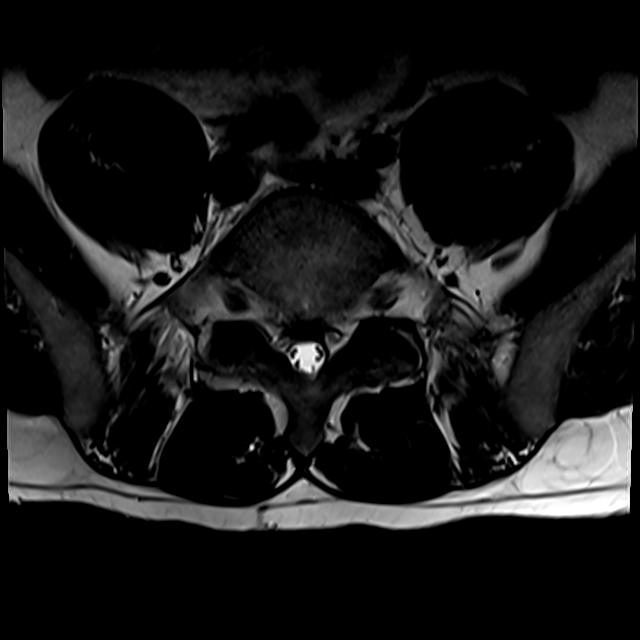
[im 12/40]
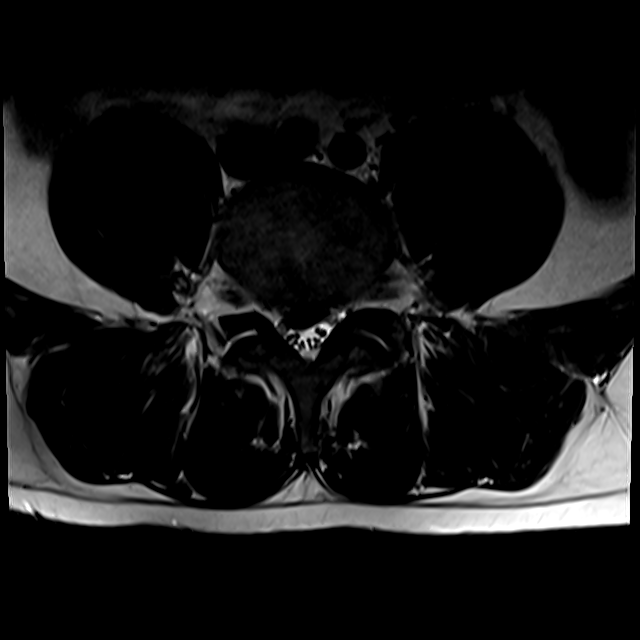
[im 17/40]
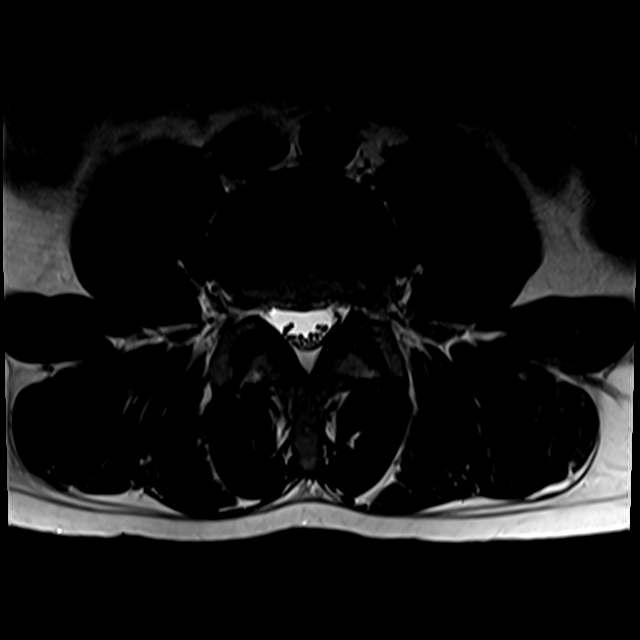
[im 20/40]
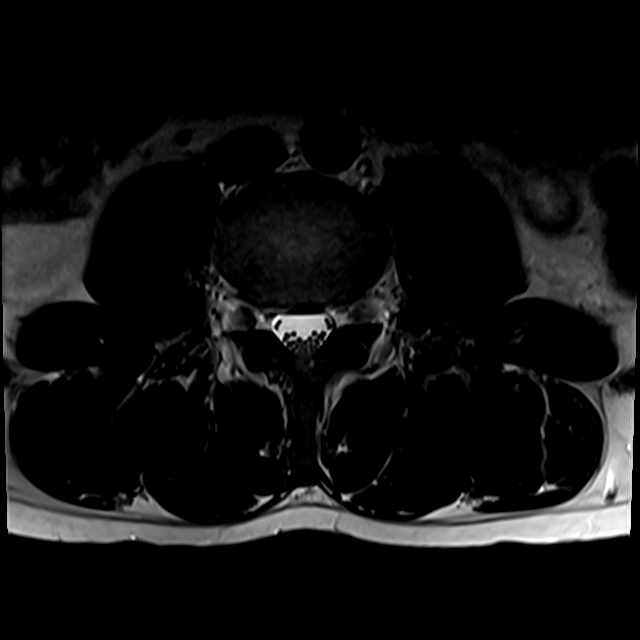
[im 34/40]
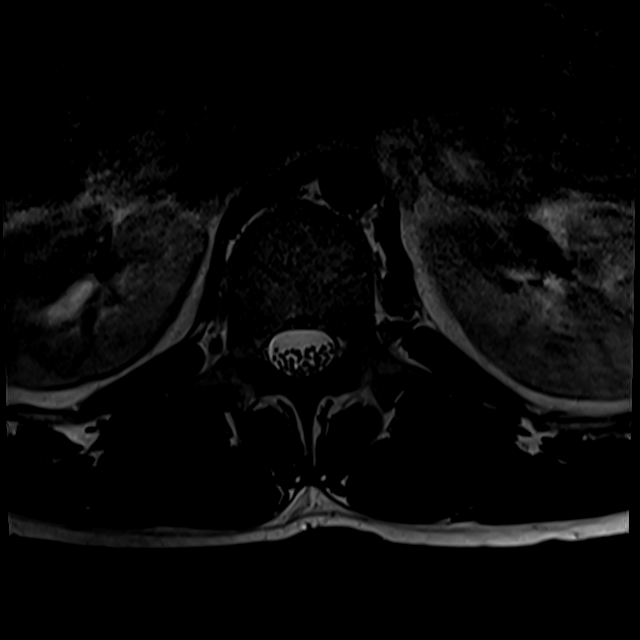

[18 of 48 positions shown; findings below may reference images not displayed]

FINDINGS: Segmentation:  5 lumbar type vertebral bodies.

Alignment:  Normal

Vertebrae:  Normal

Conus medullaris and cauda equina: Conus extends to the L1 level.
Conus and cauda equina appear normal.

Paraspinal and other soft tissues: Normal

Disc levels:

No abnormality at L2-3 or above.

L3-4: Minimal noncompressive disc bulge.

L4-5: Central to right-sided disc herniation. Stenosis of the right
lateral recess likely to compress at least the right L5 nerve. No
migrated fragment.

L5-S1: Annular fissures with annular bulging, more prominent towards
the right. Stenosis of both subarticular lateral recesses right more
than left. Right S1 nerve compression could occur.
IMPRESSION: L4-5: Central to right-sided disc herniation with right-sided
stenosis likely to compress at least the right L5 nerve.

L5-S1: Annular fissures with annular bulging more prominent towards
the right. Stenosis of the subarticular lateral recesses right more
than left. The right S1 nerve could also be compressed, though the
stenosis is not as severe as the level above.

## 2021-01-06 ENCOUNTER — Encounter: Payer: Self-pay | Admitting: Internal Medicine

## 2021-01-06 ENCOUNTER — Other Ambulatory Visit (INDEPENDENT_AMBULATORY_CARE_PROVIDER_SITE_OTHER): Payer: BC Managed Care – PPO

## 2021-01-06 DIAGNOSIS — Z0000 Encounter for general adult medical examination without abnormal findings: Secondary | ICD-10-CM | POA: Diagnosis not present

## 2021-01-06 LAB — CBC WITH DIFFERENTIAL/PLATELET
Basophils Absolute: 0 10*3/uL (ref 0.0–0.1)
Basophils Relative: 0.6 % (ref 0.0–3.0)
Eosinophils Absolute: 0.1 10*3/uL (ref 0.0–0.7)
Eosinophils Relative: 1.8 % (ref 0.0–5.0)
HCT: 41.8 % (ref 39.0–52.0)
Hemoglobin: 14.2 g/dL (ref 13.0–17.0)
Lymphocytes Relative: 38.4 % (ref 12.0–46.0)
Lymphs Abs: 1.5 10*3/uL (ref 0.7–4.0)
MCHC: 33.9 g/dL (ref 30.0–36.0)
MCV: 91.4 fl (ref 78.0–100.0)
Monocytes Absolute: 0.3 10*3/uL (ref 0.1–1.0)
Monocytes Relative: 8.6 % (ref 3.0–12.0)
Neutro Abs: 2 10*3/uL (ref 1.4–7.7)
Neutrophils Relative %: 50.6 % (ref 43.0–77.0)
Platelets: 171 10*3/uL (ref 150.0–400.0)
RBC: 4.57 Mil/uL (ref 4.22–5.81)
RDW: 13.3 % (ref 11.5–15.5)
WBC: 4 10*3/uL (ref 4.0–10.5)

## 2021-01-06 LAB — LIPID PANEL
Cholesterol: 174 mg/dL (ref 0–200)
HDL: 64.4 mg/dL (ref >39.00)
LDL Cholesterol: 99 mg/dL (ref 0–99)
NonHDL: 110
Total CHOL/HDL Ratio: 3
Triglycerides: 57 mg/dL (ref 0.0–149.0)
VLDL: 11.4 mg/dL (ref 0.0–40.0)

## 2021-01-06 LAB — BASIC METABOLIC PANEL
BUN: 16 mg/dL (ref 6–23)
CO2: 28 mEq/L (ref 19–32)
Calcium: 9.4 mg/dL (ref 8.4–10.5)
Chloride: 102 mEq/L (ref 96–112)
Creatinine, Ser: 0.94 mg/dL (ref 0.40–1.50)
GFR: 108.41 mL/min (ref >60.00)
Glucose, Bld: 117 mg/dL — ABNORMAL HIGH (ref 70–99)
Potassium: 3.9 mEq/L (ref 3.5–5.1)
Sodium: 137 mEq/L (ref 135–145)

## 2021-01-06 LAB — URINALYSIS, ROUTINE W REFLEX MICROSCOPIC
Bilirubin Urine: NEGATIVE
Hgb urine dipstick: NEGATIVE
Ketones, ur: NEGATIVE
Leukocytes,Ua: NEGATIVE
Nitrite: NEGATIVE
RBC / HPF: NONE SEEN (ref 0–?)
Specific Gravity, Urine: 1.01 (ref 1.000–1.030)
Total Protein, Urine: NEGATIVE
Urine Glucose: NEGATIVE
Urobilinogen, UA: 0.2 (ref 0.0–1.0)
pH: 7 (ref 5.0–8.0)

## 2021-01-06 LAB — HEPATIC FUNCTION PANEL
ALT: 15 U/L (ref 0–53)
AST: 19 U/L (ref 0–37)
Albumin: 4.5 g/dL (ref 3.5–5.2)
Alkaline Phosphatase: 49 U/L (ref 39–117)
Bilirubin, Direct: 0.1 mg/dL (ref 0.0–0.3)
Total Bilirubin: 0.6 mg/dL (ref 0.2–1.2)
Total Protein: 6.9 g/dL (ref 6.0–8.3)

## 2021-01-06 LAB — TSH: TSH: 0.99 u[IU]/mL (ref 0.35–5.50)

## 2021-01-17 ENCOUNTER — Encounter: Payer: BC Managed Care – PPO | Admitting: Internal Medicine

## 2021-01-19 ENCOUNTER — Other Ambulatory Visit: Payer: Self-pay | Admitting: Internal Medicine

## 2021-01-19 ENCOUNTER — Encounter: Payer: Self-pay | Admitting: Internal Medicine

## 2021-01-19 MED ORDER — KETOCONAZOLE 2 % EX SHAM: MEDICATED_SHAMPOO | CUTANEOUS | 1 refills | Status: DC

## 2021-01-20 ENCOUNTER — Encounter: Payer: BC Managed Care – PPO | Admitting: Internal Medicine

## 2021-01-30 ENCOUNTER — Ambulatory Visit (INDEPENDENT_AMBULATORY_CARE_PROVIDER_SITE_OTHER): Payer: BC Managed Care – PPO | Admitting: Internal Medicine

## 2021-01-30 ENCOUNTER — Encounter: Payer: Self-pay | Admitting: Internal Medicine

## 2021-01-30 ENCOUNTER — Other Ambulatory Visit: Payer: Self-pay

## 2021-01-30 VITALS — BP 90/58 | HR 83 | Temp 98.2°F | Ht 71.0 in | Wt 190.0 lb

## 2021-01-30 DIAGNOSIS — E559 Vitamin D deficiency, unspecified: Secondary | ICD-10-CM | POA: Diagnosis not present

## 2021-01-30 DIAGNOSIS — Z7251 High risk heterosexual behavior: Secondary | ICD-10-CM | POA: Insufficient documentation

## 2021-01-30 DIAGNOSIS — Z23 Encounter for immunization: Secondary | ICD-10-CM

## 2021-01-30 DIAGNOSIS — R739 Hyperglycemia, unspecified: Secondary | ICD-10-CM

## 2021-01-30 DIAGNOSIS — Z0001 Encounter for general adult medical examination with abnormal findings: Secondary | ICD-10-CM

## 2021-01-30 DIAGNOSIS — E538 Deficiency of other specified B group vitamins: Secondary | ICD-10-CM | POA: Diagnosis not present

## 2021-01-30 DIAGNOSIS — Z7253 High risk bisexual behavior: Secondary | ICD-10-CM

## 2021-01-30 LAB — POCT GLYCOSYLATED HEMOGLOBIN (HGB A1C): Hemoglobin A1C: 5.2 % (ref 4.0–5.6)

## 2021-01-30 NOTE — Progress Notes (Signed)
Patient ID: Ricardo Knight, male   DOB: 1991-03-24, 30 y.o.   MRN: [id number]

## 2021-01-30 NOTE — Progress Notes (Signed)
Patient ID: Ricardo Knight, male   DOB: [birth year], 30 y.o.   MRN: [id number]         Chief Complaint:: wellness exam and hyperglycemia, high risk sexual activity       HPI:  Ricardo Knight is a 30 y.o. male here for wellness exam; declines covid booster and hep c screen; o/w up to date                        Also states is bisexual and at times may be at higher risk sexual activity.  Asking about monkeypox and HIV prevention, asking for ID referral.  Pt denies chest pain, increased sob or doe, wheezing, orthopnea, PND, increased LE swelling, palpitations, dizziness or syncope.   Pt denies polydipsia, polyuria, or new focal neuro s/s.   Pt denies fever, wt loss, night sweats, loss of appetite, or other constitutional symptoms      Wt Readings from Last 3 Encounters:  01/30/21 190 lb (86.2 kg)  10/18/20 187 lb (84.8 kg)  08/03/20 189 lb 9.6 oz (86 kg)   BP Readings from Last 3 Encounters:  01/30/21 (!) 90/58  10/18/20 118/60  08/03/20 118/70   Immunization History  Administered Date(s) Administered   Influenza,inj,Quad PF,6+ Mos 03/15/2016, 02/20/2017, 02/26/2018, 01/29/2019, 01/20/2020, 01/30/2021   PFIZER(Purple Top)SARS-COV-2 Vaccination 05/27/2019, 07/01/2019   Tdap 02/26/2018   There are no preventive care reminders to display for this patient.     Past Medical History:  Diagnosis Date   Anxiety    Depression    Exercise-induced asthma    Heart palpitations    History of chicken pox    Past Surgical History:  Procedure Laterality Date   WISDOM TOOTH EXTRACTION      reports that he has quit smoking. He has never used smokeless tobacco. He reports current alcohol use of about 7.0 - 10.0 standard drinks per week. He reports that he does not use drugs. family history includes Anxiety disorder in his maternal grandmother and mother; Cancer in his paternal grandfather; Depression in his mother; Healthy in his paternal grandmother; Heart attack in his maternal grandmother;  Heart disease in his maternal grandfather; Hyperlipidemia in his father; Hypertension in his father and maternal grandfather. No Known Allergies Current Outpatient Medications on File Prior to Visit  Medication Sig Dispense Refill   ID NOW COVID-19 KIT TEST AS DIRECTED TODAY     No current facility-administered medications on file prior to visit.        ROS:  All others reviewed and negative.  Objective        PE:  BP (!) 90/58 (BP Location: Left Arm, Patient Position: Sitting, Cuff Size: Large)   Pulse 83   Temp 98.2 F (36.8 C) (Oral)   Ht _0  (1.803 m)   Wt 190 lb (86.2 kg)   SpO2 96%   BMI 26.50 kg/m                 Constitutional: Pt appears in NAD               HENT: Head: NCAT.                Right Ear: External ear normal.                 Left Ear: External ear normal.                Eyes: . Pupils are equal, round, and reactive  to light. Conjunctivae and EOM are normal               Nose: without d/c or deformity               Neck: Neck supple. Gross normal ROM               Cardiovascular: Normal rate and regular rhythm.                 Pulmonary/Chest: Effort normal and breath sounds without rales or wheezing.                Abd:  Soft, NT, ND, + BS, no organomegaly               Neurological: Pt is alert. At baseline orientation, motor grossly intact               Skin: Skin is warm. No rashes, no other new lesions, LE edema - none               Psychiatric: Pt behavior is normal without agitation   Micro: none  Cardiac tracings I have personally interpreted today:  none  Pertinent Radiological findings (summarize): none   Lab Results  Component Value Date   WBC 4.0 01/06/2021   HGB 14.2 01/06/2021   HCT 41.8 01/06/2021   PLT 171.0 01/06/2021   GLUCOSE 117 (H) 01/06/2021   CHOL 174 01/06/2021   TRIG 57.0 01/06/2021   HDL 64.40 01/06/2021   LDLCALC 99 01/06/2021   ALT 15 01/06/2021   AST 19 01/06/2021   NA 137 01/06/2021   K 3.9 01/06/2021   CL  102 01/06/2021   CREATININE 0.94 01/06/2021   BUN 16 01/06/2021   CO2 28 01/06/2021   TSH 0.99 01/06/2021   HGBA1C 5.2 01/30/2021   Hemoglobin A1C 4.0 - 5.6 % 5.2     Assessment/Plan:  Ricardo Knight is a 30 y.o. White or Caucasian [1] male with  has a past medical history of Anxiety, Depression, Exercise-induced asthma, Heart palpitations, and History of chicken pox.  Encounter for well adult exam with abnormal findings Age and sex appropriate education and counseling updated with regular exercise and diet Referrals for preventative services - declines hep c scrren, Immunizations addressed - declines covid booster Smoking counseling  - none needed Evidence for depression or other mood disorder - none significant Most recent labs reviewed. I have personally reviewed and have noted: 1) the patient's medical and social history 2) The patient's current medications and supplements 3) The patient's height, weight, and BMI have been recorded in the chart   Hyperglycemia Lab Results  Component Value Date   HGBA1C 5.2 01/30/2021   Stable, pt to continue current medical treatment  - diet   High risk sexual behavior Pt will need ID referral for further discussion regarding options for monkeypox prevention and/or HIV Prep  Followup: Return in about 1 year (around 01/30/2022).  Cathlean Cower, MD 01/30/2021 6:47 PM [facility]

## 2021-01-30 NOTE — Assessment & Plan Note (Signed)
Pt will need ID referral for further discussion regarding options for monkeypox prevention and/or HIV Prep

## 2021-01-30 NOTE — Assessment & Plan Note (Signed)
Age and sex appropriate education and counseling updated with regular exercise and diet Referrals for preventative services - declines hep c scrren, Immunizations addressed - declines covid booster Smoking counseling  - none needed Evidence for depression or other mood disorder - none significant Most recent labs reviewed. I have personally reviewed and have noted: 1) the patient's medical and social history 2) The patient's current medications and supplements 3) The patient's height, weight, and BMI have been recorded in the chart

## 2021-01-30 NOTE — Assessment & Plan Note (Signed)
Lab Results  Component Value Date   HGBA1C 5.2 01/30/2021   Stable, pt to continue current medical treatment  - diet

## 2021-01-30 NOTE — Patient Instructions (Addendum)
You had the flu shot today  Your A1c was OK today (so No Diabetes)  Please continue all other medications as before, and refills have been done if requested.  Please have the pharmacy call with any other refills you may need.  Please continue your efforts at being more active, low cholesterol diet, and weight control.  You are otherwise up to date with prevention measures today.  Please keep your appointments with your specialists as you may have planned  You will be contacted regarding the referral for: infectious disease to discuss prevention measures  Please make an Appointment to return for your 1 year visit, or sooner if needed, with Lab testing by Appointment as well, to be done about 3-5 days before at the FIRST FLOOR Lab (so this is for TWO appointments - please see the scheduling desk as you leave)   Due to the ongoing Covid 19 pandemic, our lab now requires an appointment for any labs done at our office.  If you need labs done and do not have an appointment, please call our office ahead of time to schedule before presenting to the lab for your testing.

## 2021-02-08 ENCOUNTER — Other Ambulatory Visit (HOSPITAL_COMMUNITY): Payer: Self-pay

## 2021-02-08 ENCOUNTER — Telehealth: Payer: Self-pay

## 2021-02-08 ENCOUNTER — Other Ambulatory Visit: Payer: Self-pay

## 2021-02-08 ENCOUNTER — Encounter: Payer: Self-pay | Admitting: Internal Medicine

## 2021-02-08 ENCOUNTER — Other Ambulatory Visit (HOSPITAL_COMMUNITY)
Admission: RE | Admit: 2021-02-08 | Discharge: 2021-02-08 | Disposition: A | Discharge status: Home / Self Care | Payer: BC Managed Care – PPO | Source: Ambulatory Visit | Attending: Internal Medicine | Admitting: Internal Medicine

## 2021-02-08 ENCOUNTER — Ambulatory Visit (INDEPENDENT_AMBULATORY_CARE_PROVIDER_SITE_OTHER): Payer: BC Managed Care – PPO | Admitting: Internal Medicine

## 2021-02-08 VITALS — BP 147/78 | HR 62 | Temp 97.8°F | Wt 190.2 lb

## 2021-02-08 DIAGNOSIS — Z7253 High risk bisexual behavior: Secondary | ICD-10-CM | POA: Diagnosis not present

## 2021-02-08 DIAGNOSIS — Z7185 Encounter for immunization safety counseling: Secondary | ICD-10-CM | POA: Insufficient documentation

## 2021-02-08 NOTE — Progress Notes (Signed)
[facility]  Reason for Consult:PrEP  Referring Provider: Dr Jenny Reichmann   HPI:    Ricardo Knight is a 30 y.o. male with PMHx as below who presents to the clinic for discussion of PrEP.   Patient is bisexual and has engaged in higher risk sexual activity.  He saw his primary last week and inquired about HIV prevention.  He was referred to [place] to discuss this.  No current symptoms.  Last testing for HIV was negative in August 2022.  He is also interested in Monkeypox prevention.  Patient's Medications  New Prescriptions   No medications on file  Previous Medications   ID NOW COVID-19 KIT    TEST AS DIRECTED TODAY  Modified Medications   No medications on file  Discontinued Medications   No medications on file      Past Medical History:  Diagnosis Date   Anxiety    Depression    Exercise-induced asthma    Heart palpitations    History of chicken pox     Social History   Tobacco Use   Smoking status: Former   Smokeless tobacco: Never  Vaping Use   Vaping Use: Never used  Substance Use Topics   Alcohol use: Yes    Alcohol/week: 10.0 standard drinks    Types: 10 Cans of beer per week    Comment: weekends   Drug use: Yes    Types: Marijuana    Comment: cbd weekends    Family History  Problem Relation Age of Onset   Depression Mother    Anxiety disorder Mother    Hypertension Father    Hyperlipidemia Father    Anxiety disorder Maternal Grandmother    Heart attack Maternal Grandmother    Heart disease Maternal Grandfather    Hypertension Maternal Grandfather    Healthy Paternal Grandmother    Cancer Paternal Grandfather     No Known Allergies  Review of Systems  Constitutional: Negative.   HENT: Negative.    Respiratory: Negative.    Cardiovascular: Negative.   Gastrointestinal: Negative.      OBJECTIVE:    Vitals:   02/08/21 1000  BP: (!) 147/78  Pulse: 62  Temp: 97.8 F (36.6 C)  TempSrc: Temporal  Weight: 190  lb 3.2 oz (86.3 kg)     Body mass index is 26.53 kg/m.  Physical Exam Constitutional:      General: He is not in acute distress.    Appearance: Normal appearance.  HENT:     Head: Normocephalic and atraumatic.  Pulmonary:     Effort: Pulmonary effort is normal. No respiratory distress.  Skin:    General: Skin is warm and dry.  Neurological:     General: No focal deficit present.     Mental Status: He is alert and oriented to person, place, and time.  Psychiatric:        Mood and Affect: Mood normal.        Behavior: Behavior normal.     Labs and Microbiology:  CBC Latest Ref Rng & Units 01/06/2021 01/20/2020 01/29/2019  WBC 4.0 - 10.5 K/uL 4.0 5.1 4.9  Hemoglobin 13.0 - 17.0 g/dL 14.2 14.8 14.7  Hematocrit 39.0 - 52.0 % 41.8 43.1 42.9  Platelets 150.0 - 400.0 K/uL 171.0 198.0 179.0   CMP Latest Ref Rng & Units 01/06/2021 01/20/2020 01/29/2019  Glucose 70 - 99 mg/dL 117(H) 96 97  BUN 6 - 23 mg/dL 16 17 25(H)  Creatinine  0.40 - 1.50 mg/dL 0.94 0.88 0.89  Sodium 135 - 145 mEq/L 137 139 138  Potassium 3.5 - 5.1 mEq/L 3.9 3.8 4.0  Chloride 96 - 112 mEq/L 102 101 102  CO2 19 - 32 mEq/L _0 Calcium 8.4 - 10.5 mg/dL 9.4 10.1 9.7  Total Protein 6.0 - 8.3 g/dL 6.9 7.4 7.5  Total Bilirubin 0.2 - 1.2 mg/dL 0.6 0.5 0.4  Alkaline Phos 39 - 117 U/L 49 61 68  AST 0 - 37 U/L _1 ALT 0 - 53 U/L _2 ASSESSMENT & PLAN:    Vaccine counseling Recommended Monkeypox vaccine which patient states he would be interested tin receiving.  Unfortunately, right now we do not have availability for pts to receive first dose.  Added him to our call back list and also gave him the health department number.   High risk sexual behavior Discussed with patients risk/benefits of PrEP and he is interested in starting oral therapy.  Also, had him meet with research to discuss Purpose 2 study, however, he does not meet inclusion criteria.  Discussed Apretude vs daily Descovy and he  prefers oral therapy.  Will check labs today and prescribe medication if HIV testing negative.  RTC 3 months.    Orders Placed This Encounter  Procedures   Basic metabolic panel    Order Specific Question:   Has the patient fasted?    Answer:   No   Hepatitis A antibody, total   Hepatitis B surface antibody,qualitative   Hepatitis B surface antigen   Hepatitis C antibody   HIV Antibody (routine testing w rflx)   RPR   HIV-1 RNA quant-no reflex-bld         [facility] 02/08/2021, 10:46 AM   I spent 45 minutes dedicated to the care of this patient on the date of this encounter to include pre-visit review of records, face-to-face time with the patient discussing vaccines and PREP, and post-visit ordering of testing.

## 2021-02-08 NOTE — Assessment & Plan Note (Signed)
Recommended Monkeypox vaccine which patient states he would be interested tin receiving.  Unfortunately, right now we do not have availability for pts to receive first dose.  Added him to our call back list and also gave him the health department number.

## 2021-02-08 NOTE — Telephone Encounter (Signed)
[facility] Patient Product/process development scientist completed.    The patient is insured through [employer] and has a 0.00 copay.  Apretude will need a PA.  We will continue to follow to see if copay assistance is needed.  Clearance Coots, CPhT Specialty Pharmacy Patient [facility] Phone: [phone] Fax:  [phone]

## 2021-02-08 NOTE — Assessment & Plan Note (Signed)
Discussed with patients risk/benefits of PrEP and he is interested in starting oral therapy.  Also, had him meet with research to discuss Purpose 2 study, however, he does not meet inclusion criteria.  Discussed Apretude vs daily Descovy and he prefers oral therapy.  Will check labs today and prescribe medication if HIV testing negative.  RTC 3 months.

## 2021-02-08 NOTE — Patient Instructions (Addendum)
Thank you for coming to see me today. It was a pleasure seeing you.  To Do: I added your name to our list of people to contact for Monkepox vaccination when we get more supply in stock You can also try contacting the health department at [phone] with questions or to see if they have any availability Labs today.  Once we have confirmed you do not have HIV, I will prescribe Descovy to take daily Follow up in 3 months. Send me a My chart message with pharmacy that you would like your prescription sent to.  If you have any questions or concerns, please do not hesitate to call the office at [phone].  Take Care,   Gwynn Burly

## 2021-02-09 ENCOUNTER — Telehealth: Payer: Self-pay

## 2021-02-09 LAB — URINE CYTOLOGY ANCILLARY ONLY
Chlamydia: NEGATIVE
Comment: NEGATIVE
Comment: NORMAL
Neisseria Gonorrhea: NEGATIVE

## 2021-02-09 LAB — CYTOLOGY, (ORAL, ANAL, URETHRAL) ANCILLARY ONLY
Chlamydia: NEGATIVE
Comment: NEGATIVE
Comment: NORMAL
Neisseria Gonorrhea: NEGATIVE

## 2021-02-09 MED ORDER — EMTRICITABINE-TENOFOVIR AF 200-25 MG PO TABS: 1.0000 | ORAL_TABLET | Freq: Every day | ORAL | 2 refills | Status: DC

## 2021-02-09 NOTE — Addendum Note (Signed)
Addended by: Kathlynn Grate on: 02/09/2021 02:04 PM   Modules accepted: Orders

## 2021-02-09 NOTE — Telephone Encounter (Signed)
-----   Message from Kathlynn Grate, DO sent at 02/09/2021  2:02 PM EST ----- Please let patient know that HIV test was negative and I will prescribe 3 month supply Descovy to his pharmacy.  Other labs showed he is immune to hepatitis A and B from vaccination and hepatitis C was negative.  His GC/CT testing is pending and I will update him once those result.  Thanks

## 2021-02-09 NOTE — Telephone Encounter (Signed)
Informed patient of negative HIV and HEP C test and immune to Hep A and Hep B due to vaccination. Patient aware 3 month supply Desovy has been sent to pharmacy. STI tests still pending and we will call when we receive results. Patient verbalized his understanding.   Cadince Hilscher Lesli Albee, CMA

## 2021-02-10 ENCOUNTER — Telehealth: Payer: Self-pay

## 2021-02-10 NOTE — Telephone Encounter (Signed)
-----   Message from Kathlynn Grate, DO sent at 02/10/2021 12:40 PM EST ----- Can you please let pt know his GC/CT testing was also negative.   Thanks

## 2021-02-12 LAB — BASIC METABOLIC PANEL
BUN: 18 mg/dL (ref 7–25)
CO2: 30 mmol/L (ref 20–32)
Calcium: 10 mg/dL (ref 8.6–10.3)
Chloride: 102 mmol/L (ref 98–110)
Creat: 0.88 mg/dL (ref 0.60–1.26)
Glucose, Bld: 87 mg/dL (ref 65–99)
Potassium: 3.9 mmol/L (ref 3.5–5.3)
Sodium: 139 mmol/L (ref 135–146)

## 2021-02-12 LAB — HEPATITIS A ANTIBODY, TOTAL: Hepatitis A AB,Total: REACTIVE — AB

## 2021-02-12 LAB — HIV-1 RNA QUANT-NO REFLEX-BLD
HIV 1 RNA Quant: NOT DETECTED Copies/mL
HIV-1 RNA Quant, Log: NOT DETECTED Log cps/mL

## 2021-02-12 LAB — HEPATITIS B SURFACE ANTIBODY,QUALITATIVE: Hep B S Ab: REACTIVE — AB

## 2021-02-12 LAB — HIV ANTIBODY (ROUTINE TESTING W REFLEX): HIV 1&2 Ab, 4th Generation: NONREACTIVE

## 2021-02-12 LAB — HEPATITIS C ANTIBODY
Hepatitis C Ab: NONREACTIVE
SIGNAL TO CUT-OFF: 0.03 (ref <1.00)

## 2021-02-12 LAB — HEPATITIS B SURFACE ANTIGEN: Hepatitis B Surface Ag: NONREACTIVE

## 2021-02-12 LAB — RPR: RPR Ser Ql: NONREACTIVE

## 2021-03-14 ENCOUNTER — Telehealth: Payer: Self-pay

## 2021-03-14 ENCOUNTER — Other Ambulatory Visit (HOSPITAL_COMMUNITY): Payer: Self-pay

## 2021-03-14 NOTE — Telephone Encounter (Signed)
[facility] Patient Advocate Encounter   Was successful in obtaining a [employer] copay card for Descovy.  This copay card will make the patients copay 0.00.  I have spoken with the patient.    The billing information is as follows and has been shared with [employer].       Clearance Coots, CPhT Specialty Pharmacy Patient [facility] Phone: [phone] Fax:  [phone]

## 2021-04-24 ENCOUNTER — Telehealth: Payer: Self-pay

## 2021-04-24 ENCOUNTER — Other Ambulatory Visit: Payer: Self-pay

## 2021-04-24 MED ORDER — EMTRICITABINE-TENOFOVIR AF 200-25 MG PO TABS: 1.0000 | ORAL_TABLET | Freq: Every day | ORAL | 1 refills | Status: AC

## 2021-04-24 NOTE — Telephone Encounter (Signed)
Patient called requesting a refill on [place]. Patient recently moved to [place] and plans to transfer care. Refill sent to patient pharmacy.    Pietra Zuluaga Lesli Albee, CMA

## 2021-04-27 ENCOUNTER — Ambulatory Visit: Payer: BC Managed Care – PPO | Admitting: Internal Medicine

## 2021-05-01 ENCOUNTER — Other Ambulatory Visit (HOSPITAL_COMMUNITY): Payer: Self-pay

## 2021-05-05 ENCOUNTER — Other Ambulatory Visit (HOSPITAL_COMMUNITY): Payer: Self-pay

## 2021-05-24 ENCOUNTER — Other Ambulatory Visit (HOSPITAL_COMMUNITY): Payer: Self-pay
